# Patient Record
Sex: Female | Born: 1995 | Hispanic: Yes | Marital: Single | Smoking: Never smoker
Health system: Southern US, Community
[De-identification: ages and names within clinical notes are randomized; demographics above are authoritative.]

## PROBLEM LIST (undated history)

## (undated) ENCOUNTER — Inpatient Hospital Stay (HOSPITAL_COMMUNITY): Payer: Self-pay

## (undated) DIAGNOSIS — Z789 Other specified health status: Secondary | ICD-10-CM

## (undated) HISTORY — PX: WISDOM TOOTH EXTRACTION: SHX21

## (undated) HISTORY — PX: NO PAST SURGERIES: SHX2092

---

## 2017-04-14 ENCOUNTER — Other Ambulatory Visit: Payer: Self-pay

## 2017-04-14 ENCOUNTER — Inpatient Hospital Stay (HOSPITAL_COMMUNITY)
Admission: AD | Admit: 2017-04-14 | Discharge: 2017-04-14 | Disposition: A | Discharge status: Home / Self Care | Payer: Self-pay | Source: Ambulatory Visit | Attending: Family Medicine | Admitting: Family Medicine

## 2017-04-14 ENCOUNTER — Inpatient Hospital Stay (HOSPITAL_COMMUNITY): Payer: Self-pay

## 2017-04-14 ENCOUNTER — Encounter (HOSPITAL_COMMUNITY): Payer: Self-pay | Admitting: *Deleted

## 2017-04-14 DIAGNOSIS — N760 Acute vaginitis: Secondary | ICD-10-CM

## 2017-04-14 DIAGNOSIS — O26891 Other specified pregnancy related conditions, first trimester: Secondary | ICD-10-CM

## 2017-04-14 DIAGNOSIS — R102 Pelvic and perineal pain unspecified side: Secondary | ICD-10-CM

## 2017-04-14 DIAGNOSIS — O23591 Infection of other part of genital tract in pregnancy, first trimester: Secondary | ICD-10-CM | POA: Insufficient documentation

## 2017-04-14 DIAGNOSIS — O3401 Maternal care for unspecified congenital malformation of uterus, first trimester: Secondary | ICD-10-CM

## 2017-04-14 DIAGNOSIS — Z3A01 Less than 8 weeks gestation of pregnancy: Secondary | ICD-10-CM | POA: Insufficient documentation

## 2017-04-14 DIAGNOSIS — B9689 Other specified bacterial agents as the cause of diseases classified elsewhere: Secondary | ICD-10-CM | POA: Insufficient documentation

## 2017-04-14 DIAGNOSIS — Q512 Other doubling of uterus, unspecified: Secondary | ICD-10-CM

## 2017-04-14 DIAGNOSIS — Z880 Allergy status to penicillin: Secondary | ICD-10-CM | POA: Insufficient documentation

## 2017-04-14 LAB — WET PREP, GENITAL
Sperm: NONE SEEN
TRICH WET PREP: NONE SEEN
YEAST WET PREP: NONE SEEN

## 2017-04-14 LAB — CBC
HEMATOCRIT: 37.5 % (ref 36.0–46.0)
HEMOGLOBIN: 13 g/dL (ref 12.0–15.0)
MCH: 29 pg (ref 26.0–34.0)
MCHC: 34.7 g/dL (ref 30.0–36.0)
MCV: 83.5 fL (ref 78.0–100.0)
Platelets: 243 10*3/uL (ref 150–400)
RBC: 4.49 MIL/uL (ref 3.87–5.11)
RDW: 12.7 % (ref 11.5–15.5)
WBC: 14.1 10*3/uL — AB (ref 4.0–10.5)

## 2017-04-14 LAB — URINALYSIS, ROUTINE W REFLEX MICROSCOPIC
Bilirubin Urine: NEGATIVE
GLUCOSE, UA: 50 mg/dL — AB
Hgb urine dipstick: NEGATIVE
KETONES UR: NEGATIVE mg/dL
LEUKOCYTES UA: NEGATIVE
NITRITE: NEGATIVE
PROTEIN: NEGATIVE mg/dL
Specific Gravity, Urine: 1.025 (ref 1.005–1.030)
pH: 6 (ref 5.0–8.0)

## 2017-04-14 LAB — POCT PREGNANCY, URINE: Preg Test, Ur: POSITIVE — AB

## 2017-04-14 LAB — HCG, QUANTITATIVE, PREGNANCY: HCG, BETA CHAIN, QUANT, S: 85207 m[IU]/mL — AB (ref <5)

## 2017-04-14 MED ORDER — METRONIDAZOLE 500 MG PO TABS: 500.0000 mg | ORAL_TABLET | Freq: Two times a day (BID) | ORAL | 0 refills | Status: DC

## 2017-04-14 NOTE — Discharge Instructions (Signed)
Primer trimestre de embarazo °(First Trimester of Pregnancy) °El primer trimestre de embarazo se extiende desde la semana 1 hasta el final de la semana 12 (mes 1 al mes 3). Durante este tiempo, el bebé comenzará a desarrollarse dentro suyo. Entre la semana 6 y la 8, se forman los ojos y el rostro, y los latidos del corazón pueden verse en la ecografía. Al final de las 12 semanas, todos los órganos del bebé están formados. La atención prenatal es toda la asistencia médica que usted recibe antes del nacimiento del bebé. Asegúrese de recibir una buena atención prenatal y de seguir todas las indicaciones del médico. °CUIDADOS EN EL HOGAR °Medicamentos: °· Tome los medicamentos solamente como se lo haya indicado el médico. Algunos medicamentos se pueden tomar durante el embarazo y otros no. °· Tome las vitaminas prenatales como se lo haya indicado el médico. °· Tome el medicamento que la ayuda a defecar (laxante suave) según sea necesario, si el médico lo autoriza. °Dieta °· Ingiera alimentos saludables de manera regular. °· El médico le indicará la cantidad de peso que puede aumentar. °· No coma carne cruda ni quesos sin cocinar. °· Si tiene malestar estomacal (náuseas) o vomita: °? Ingiera 4 o 5 comidas pequeñas por día en lugar de 3 abundantes. °? Intente comer algunas galletitas saladas. °? Beba líquidos entre las comidas, en lugar de hacerlo durante estas. °· Si tiene dificultad para defecar (estreñimiento): °? Consuma alimentos con alto contenido de fibra, como verduras y frutas frescos, y cereales integrales. °? Beba suficiente líquido para mantener el pis (orina) claro o de color amarillo pálido. °Actividad y ejercicios °· Haga ejercicios solamente como se lo haya indicado el médico. Deje de hacer ejercicios si tiene cólicos o dolor en la parte baja del vientre (abdomen) o en la cintura. °· Intente no estar de pie durante mucho tiempo. Mueva las piernas con frecuencia si debe estar de pie en un lugar durante  mucho tiempo. °· Evite levantar pesos excesivos. °· Use zapatos con tacones bajos. Mantenga una buena postura al sentarse y pararse. °· Puede tener relaciones sexuales, a menos que el médico le indique lo contrario. °Alivio del dolor o las molestias °· Use un sostén que le brinde buen soporte si le duelen las mamas. °· Dese baños con agua tibia (baños de asiento) para aliviar el dolor o las molestias a causa de las hemorroides. Use crema antihemorroidal si el médico se lo permite. °· Descanse con las piernas elevadas si tiene calambres o dolor de cintura. °· Use medias de descanso si tiene las venas de las piernas hinchadas y abultadas (venas varicosas). Eleve los pies durante 15 minutos, 3 o 4 veces por día. Limite la cantidad de sal en su dieta. °Cuidados prenatales °· Programe las visitas prenatales para la semana 12 de embarazo. °· Escriba sus preguntas. Llévelas cuando concurra a las visitas prenatales. °· Concurra a todas las visitas prenatales como se lo haya indicado el médico. °Seguridad °· Colóquese el cinturón de seguridad cuando conduzca. °· Haga una lista con los números de teléfono en caso de emergencia, en la cual deben incluirse los números de los familiares, los amigos, el hospital y los departamentos de policía y de bomberos. °Consejos generales °· Pídale al médico que la derive a clases prenatales en su localidad. Debe comenzar a tomar las clases antes de entrar en el mes 6 de embarazo. °· Pida ayuda si necesita asesoramiento o asistencia con la alimentación. El médico puede aconsejarla o indicarle dónde recurrir para recibir ayuda. °· No se dé baños de inmersión en agua caliente, baños   turcos ni saunas.  No se haga duchas vaginales ni use tampones o toallas higinicas perfumadas.  No mantenga las piernas cruzadas durante [place] tiempo.  Evite el contacto con las bandejas sanitarias de los gatos y la tierra que estos animales usan.  No fume, no consuma hierbas ni beba alcohol. No tome  frmacos que el mdico no haya autorizado.  No consuma ningn producto que contenga tabaco, lo que incluye cigarrillos, tabaco de Theatre managermascar o Administrator, Civil Servicecigarrillos electrnicos. Si necesita ayuda para dejar de fumar, consulte al American Expressmdico. Puede recibir asesoramiento u otro tipo de apoyo para dejar de fumar.  Visite al dentista. En su casa, lvese los dientes con un cepillo dental suave. Psese el hilo dental con suavidad. SOLICITE AYUDA SI:  Tiene mareos.  Tiene clicos leves o siente presin en la parte baja del vientre.  Siente un dolor persistente en la zona del vientre.  Sigue teniendo AT&Tmalestar estomacal, vomita o las heces son lquidas (diarrea).  Observa una secrecin, con mal olor que proviene de la vagina.  Siente dolor al ConocoPhillipsorinar.  Tiene el rostro, las Fort Hunter Liggettmanos, las piernas o los tobillos ms hinchados (inflamados).  SOLICITE AYUDA DE INMEDIATO SI:  Tiene fiebre.  Tiene una prdida de lquido por la vagina.  Tiene sangrado o pequeas prdidas vaginales.  Tiene clicos o dolor muy intensos en el vientre.  Sube o baja de peso rpidamente.  Vomita sangre. Puede ser similar a la borra del caf  Est en contacto con personas que tienen rubola, la quinta enfermedad o varicela.  Siente un dolor de cabeza muy intenso.  Le falta el aire.  Sufre cualquier tipo de traumatismo, por ejemplo, debido a una cada o un accidente automovilstico.  Esta informacin no tiene Theme park managercomo fin reemplazar el consejo del mdico. Asegrese de hacerle al mdico cualquier pregunta que tenga. Document Released: 07/23/2008 Document Revised: 05/17/2014 Document Reviewed: 03/06/2013 Elsevier Interactive Patient Education  2017 Elsevier Inc. Vaginosis bacteriana (Bacterial Vaginosis) La vaginosis bacteriana es una infeccin vaginal que perturba el equilibrio normal de las bacterias que se encuentran en la vagina. Es el resultado de un crecimiento excesivo de ciertas bacterias. Esta es la infeccin vaginal ms  frecuente en mujeres en edad reproductiva. El tratamiento es importante para prevenir complicaciones, especialmente en mujeres embarazadas, dado que puede causar un parto prematuro. CAUSAS La vaginosis bacteriana se origina por un aumento de bacterias nocivas que, generalmente, estn presentes en cantidades ms pequeas en la vagina. Varios tipos diferentes de bacterias pueden causar esta afeccin. Sin embargo, la causa de su desarrollo no se comprende totalmente. FACTORES DE RIESGO Ciertas actividades o comportamientos pueden exponerlo a un mayor riesgo de desarrollar vaginosis bacteriana, entre los que se incluyen:  Tener una nueva pareja sexual o mltiples parejas sexuales.  Las duchas vaginales  El uso del DIU (dispositivo intrauterino) como mtodo anticonceptivo. El contagio no se produce en baos, por ropas de cama, en piscinas o por contacto con objetos. SIGNOS Y SNTOMAS Algunas mujeres que padecen vaginosis bacteriana no presentan signos ni sntomas. Los sntomas ms comunes son:  Secrecin vaginal de color grisceo.  Secrecin vaginal con olor similar al Wal-Martpescado, especialmente despus de Sales promotion account executivemantener relaciones sexuales.  Picazn o sensacin de ardor en la vagina o la vulva.  Ardor o dolor al ConocoPhillipsorinar. DIAGNSTICO Su mdico analizar su historia clnica y le examinar la vagina para detectar signos de vaginosis bacteriana. Puede tomarle Lauris Poaguna muestra de flujo vaginal. Su mdico examinar esta muestra con un microscopio para controlar las bacterias y clulas anormales. Tambin  puede realizarse un anlisis del pH vaginal. TRATAMIENTO La vaginosis bacteriana puede tratarse con antibiticos, en forma de comprimidos o de crema vaginal. Puede indicarse una segunda tanda de antibiticos si la afeccin se repite despus del tratamiento. Debido a que la vaginosis bacteriana aumenta el riesgo de contraer enfermedades de transmisin sexual, el tratamiento puede ayudar a reducir el riesgo de clamidia,  Willitsgonorrea, VIH y herpes. INSTRUCCIONES PARA EL CUIDADO EN EL HOGAR  Tome solo medicamentos de venta libre o recetados, segn las indicaciones del mdico.  Si le han recetado antibiticos, tmelos como se le indic. Asegrese de que finaliza la prescripcin completa aunque se sienta mejor.  Comunique a sus compaeros sexuales que sufre una infeccin vaginal. Deben consultar a su mdico y recibir tratamiento si tienen problemas, como picazn o una erupcin cutnea leve.  Durante el Monmouthtratamiento, es importante que siga estas indicaciones: ? Soil scientistvite mantener relaciones sexuales o use preservativos de Network engineerla forma correcta. ? No se haga duchas vaginales. ? Evite consumir alcohol como se lo haya indicado el mdico. ? Evite amamantar como se lo haya indicado el mdico.  SOLICITE ATENCIN MDICA SI:  Sus sntomas no mejoran despus de 3 das de Fowlertratamiento.  Aumenta la secrecin o Chief Technology Officerel dolor.  Tiene fiebre.  ASEGRESE DE QUE:  Comprende estas instrucciones.  Controlar su afeccin.  Recibir ayuda de inmediato si no mejora o si empeora.  PARA OBTENER MS INFORMACIN Centros para el control y la prevencin de Child psychotherapistenfermedades (Centers for Disease Control and Prevention, CDC): SolutionApps.co.zawww.cdc.gov/std Asociacin Estadounidense de la Salud Sexual (American Sexual Health Association, SHA): www.ashastd.org Esta informacin no tiene Theme park managercomo fin reemplazar el consejo del mdico. Asegrese de hacerle al mdico cualquier pregunta que tenga. Document Released: 08/03/2007 Document Revised: 05/17/2014 Document Reviewed: 12/06/2012 Elsevier Interactive Patient Education  2017 Arvin[employer]itorElsevier Inc.

## 2017-04-14 NOTE — MAU Provider Note (Signed)
Chief Complaint: Abdominal Pain   First Provider Initiated Contact with Patient 04/14/17 1744        SUBJECTIVE HPI: Stephanie Quinn is a 21 y.o. G1P0 at [redacted]w[redacted]d by LMP who presents to maternity admissions reporting pelvic pain for 3 days.  Somewhat crampy.  No bleeding.  Wants ultrasound..This is her first pregnancy She denies vaginal bleeding, vaginal itching/burning, urinary symptoms, h/a, dizziness, n/v, or fever/chills.    Has had no care yet, but has appointment at [facility]  Abdominal Pain  This is a new problem. The current episode started in the past 7 days. The onset quality is gradual. The problem occurs intermittently. The problem has been gradually improving. The pain is located in the LLQ, RLQ and suprapubic region. The pain is mild. The quality of the pain is cramping. The abdominal pain does not radiate. Pertinent negatives include no constipation, diarrhea, dysuria, fever, frequency, headaches, myalgias, nausea or vomiting. Nothing aggravates the pain. The pain is relieved by nothing. She has tried nothing for the symptoms.    RN Note: Pt reports some vaginal and abd pain x 3 days. Denies any vaginal bleeding or discharge. Positive pregnancy test at a clinic in November 28.    History reviewed. No pertinent past medical history. History reviewed. No pertinent surgical history. Social History   Socioeconomic History  . Marital status: Single    Spouse name: Not on file  . Number of children: Not on file  . Years of education: Not on file  . Highest education level: Not on file  Social Needs  . Financial resource strain: Not on file  . Food insecurity - worry: Not on file  . Food insecurity - inability: Not on file  . Transportation needs - medical: Not on file  . Transportation needs - non-medical: Not on file  Occupational History  . Not on file  Tobacco Use  . Smoking status: Never Smoker  . Smokeless tobacco: Never Used  Substance and Sexual  Activity  . Alcohol use: No    Frequency: Never  . Drug use: No  . Sexual activity: No  Other Topics Concern  . Not on file  Social History Narrative  . Not on file   No current facility-administered medications on file prior to encounter.    No current outpatient medications on file prior to encounter.   Allergies  Allergen Reactions  . Penicillins Itching and Rash    I have reviewed patient's Past Medical Hx, Surgical Hx, Family Hx, Social Hx, medications and allergies.   ROS:  Review of Systems  Constitutional: Negative for fever.  Gastrointestinal: Positive for abdominal pain. Negative for constipation, diarrhea, nausea and vomiting.  Genitourinary: Negative for dysuria and frequency.  Musculoskeletal: Negative for myalgias.  Neurological: Negative for headaches.    Other systems negative   Physical Exam  Physical Exam Patient Vitals for the past 24 hrs:  BP Temp Pulse Resp Height Weight  04/14/17 1639 (!) 120/59 98.1 F (36.7 C) 71 18 5' (1.524 m) 141 lb (64 kg)   Constitutional: Well-developed, well-nourished female in no acute distress.  Cardiovascular: normal rate Respiratory: normal effort GI: Abd soft, non-tender. Pos BS x 4 MS: Extremities nontender, no edema, normal ROM Neurologic: Alert and oriented x 4.  GU: Neg CVAT.  PELVIC EXAM: Cervix pink, visually closed, without lesion, scant white creamy discharge, vaginal walls and external genitalia normal Bimanual exam: Cervix 0/long/high, firm, anterior, neg CMT, uterus nontender, nonenlarged, adnexa without tenderness, enlargement, or mass  LAB RESULTS Results for orders placed or performed during the hospital encounter of 04/14/17 (from the past 24 hour(s))  Urinalysis, Routine w reflex microscopic     Status: Abnormal   Collection Time: 04/14/17  4:41 PM  Result Value Ref Range   Color, Urine YELLOW YELLOW   APPearance HAZY (A) CLEAR   Specific Gravity, Urine 1.025 1.005 - 1.030   pH 6.0 5.0 -  8.0   Glucose, UA 50 (A) NEGATIVE mg/dL   Hgb urine dipstick NEGATIVE NEGATIVE   Bilirubin Urine NEGATIVE NEGATIVE   Ketones, ur NEGATIVE NEGATIVE mg/dL   Protein, ur NEGATIVE NEGATIVE mg/dL   Nitrite NEGATIVE NEGATIVE   Leukocytes, UA NEGATIVE NEGATIVE  Wet prep, genital     Status: Abnormal   Collection Time: 04/14/17  4:47 PM  Result Value Ref Range   Yeast Wet Prep HPF POC NONE SEEN NONE SEEN   Trich, Wet Prep NONE SEEN NONE SEEN   Clue Cells Wet Prep HPF POC PRESENT (A) NONE SEEN   WBC, Wet Prep HPF POC MODERATE (A) NONE SEEN   Sperm NONE SEEN   Pregnancy, urine POC     Status: Abnormal   Collection Time: 04/14/17  4:58 PM  Result Value Ref Range   Preg Test, Ur POSITIVE (A) NEGATIVE  CBC     Status: Abnormal   Collection Time: 04/14/17  5:02 PM  Result Value Ref Range   WBC 14.1 (H) 4.0 - 10.5 K/uL   RBC 4.49 3.87 - 5.11 MIL/uL   Hemoglobin 13.0 12.0 - 15.0 g/dL   HCT 16.137.5 09.636.0 - 04.546.0 %   MCV 83.5 78.0 - 100.0 fL   MCH 29.0 26.0 - 34.0 pg   MCHC 34.7 30.0 - 36.0 g/dL   RDW 40.912.7 81.111.5 - 91.415.5 %   Platelets 243 150 - 400 K/uL  hCG, quantitative, pregnancy     Status: Abnormal   Collection Time: 04/14/17  5:02 PM  Result Value Ref Range   hCG, Beta Chain, Quant, S 85,207 (H) <5 mIU/mL    IMAGING Kore[place] Ob Comp Less 14 Wks  Result Date: 04/14/2017 CLINICAL DATA:  Pelvic pain with positive pregnancy test. EXAM: OBSTETRIC <14 WK US AND TRANSVAGINAL OB US TECHNIQUE: Both transabdominal and transvaginal ultrasound examinations were performed for complete evaluation of the gestation as well as the maternal uterus, adnexal regions, and pelvic cul-de-sac. Transvaginal technique was performed to assess early pregnancy. COMPARISON:  None. FINDINGS: Intrauterine gestational sac: Single. Yolk sac:  Visualized. Embryo:  Visualized. Cardiac Activity: Visualized. Heart Rate: 134  bpm CRL:  8  mm   6 w   5 d                  US EDC: 12/03/2017 Subchorionic hemorrhage:  Small Maternal  uterus/adnexae: Maternal ovaries unremarkable. 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 IMPRESSION: 1. Single living intrauterine gestation at estimated 6 week 5 day gestational age by crown-rump length. 2. 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 Electronically Signed   By: Kennith CenterEric  Mansell M.D.   On: 04/14/2017 19:28   [place] Ob Transvaginal  Result Date: 04/14/2017 CLINICAL DATA:  Pelvic pain with positive pregnancy test. EXAM: OBSTETRIC <14 WK US AND TRANSVAGINAL OB US TECHNIQUE: Both transabdominal and transvaginal ultrasound examinations were performed for complete evaluation of the gestation as well as the maternal uterus, adnexal regions, and pelvic cul-de-sac. Transvaginal technique was performed to assess early pregnancy. COMPARISON:  None. FINDINGS: Intrauterine  gestational sac: Single. Yolk sac:  Visualized. Embryo:  Visualized. Cardiac Activity: Visualized. Heart Rate: 134  bpm CRL:  8  mm   6 w   5 d                  US EDC: 12/03/2017 Subchorionic hemorrhage:  Small Maternal uterus/adnexae: Maternal ovaries unremarkable. 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 IMPRESSION: 1. Single living intrauterine gestation at estimated 6 week 5 day gestational age by crown-rump length. 2. 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 Electronically Signed   By: Kennith CenterEric  Mansell M.D.   On: 04/14/2017 19:28    MAU Management/MDM: Ordered usual first trimester r/o ectopic labs.   Pelvic exam and cultures done Will check baseline Ultrasound to rule out ectopic.  This bleeding/pain can represent a normal pregnancy with bleeding, spontaneous abortion or even an ectopic which can be life-threatening.  The process as listed above helps to determine which of these is present.  Discussed results of ultrasound. Recommend pelvic  rest.   ASSESSMENT 1. Pelvic pain affecting pregnancy in first trimester, antepartum   2. Pelvic pain affecting pregnancy in first trimester, antepartum   3.    Single IUP at 5971w5d 4.      Bacterial Vaginosis 5.      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 Encouraged to return here or to other Urgent Care/ED if she develops worsening of symptoms, increase in pain, fever, or other concerning symptoms.    Wynelle BourgeoisMarie Williams CNM, MSN Certified Nurse-Midwife 04/14/2017  5:45 PM

## 2017-04-14 NOTE — MAU Note (Signed)
Pt reports some vaginal and abd pain x 3 days. Denies any vaginal bleeding or discharge. Positive pregnancy test at a clinic in November 28.

## 2017-04-15 LAB — GC/CHLAMYDIA PROBE AMP (~~LOC~~) NOT AT ARMC
CHLAMYDIA, DNA PROBE: NEGATIVE
NEISSERIA GONORRHEA: NEGATIVE

## 2017-04-15 LAB — HIV ANTIBODY (ROUTINE TESTING W REFLEX): HIV SCREEN 4TH GENERATION: NONREACTIVE

## 2017-05-06 LAB — OB RESULTS CONSOLE ABO/RH: RH Type: POSITIVE

## 2017-05-06 LAB — OB RESULTS CONSOLE RUBELLA ANTIBODY, IGM: RUBELLA: IMMUNE

## 2017-05-06 LAB — OB RESULTS CONSOLE GC/CHLAMYDIA: Gonorrhea: NEGATIVE

## 2017-05-06 LAB — OB RESULTS CONSOLE HIV ANTIBODY (ROUTINE TESTING): HIV: NONREACTIVE

## 2017-05-06 LAB — OB RESULTS CONSOLE RPR: RPR: NONREACTIVE

## 2017-05-06 LAB — OB RESULTS CONSOLE ANTIBODY SCREEN: ANTIBODY SCREEN: NEGATIVE

## 2017-05-06 LAB — OB RESULTS CONSOLE HEPATITIS B SURFACE ANTIGEN: HEP B S AG: NEGATIVE

## 2017-05-09 ENCOUNTER — Other Ambulatory Visit (HOSPITAL_COMMUNITY): Payer: Self-pay | Admitting: Nurse Practitioner

## 2017-05-09 DIAGNOSIS — Z3A13 13 weeks gestation of pregnancy: Secondary | ICD-10-CM

## 2017-05-09 DIAGNOSIS — Z3682 Encounter for antenatal screening for nuchal translucency: Secondary | ICD-10-CM

## 2017-05-10 NOTE — L&D Delivery Note (Signed)
Patient is a 22 y.o. now G1P1 s/p NSVD at 7155w3d, who was admitted for SOL.  She progressed with augmentation (AROM) to complete and pushed 20 minutes to deliver.  Cord clamping delayed by several minutes then clamped by CNM and cut by FOB.  Placenta intact and spontaneous, bleeding minimal.  Bilateral labial laceration identified not repaired- hemostatic.  Mom and baby stable prior to transfer to postpartum. She plans on breast and formula feeding. She requests POPs for birth control.  Delivery Note At 12:11 AM a viable and healthy female was delivered via Vaginal, Spontaneous (Presentation: LOA ).  APGAR: 9, 9; weight 6 lb 4.9 oz (2860 g).   Placenta intact and spontaneous, bleeding minimal. 3V Cord:  with no complications.  Anesthesia: Epidural  Episiotomy: None Lacerations: Labial Suture Repair: none Est. Blood Loss (mL): 150  Mom to postpartum.  Baby to Couplet care / Skin to Skin.  Sharyon CableVeronica C Kaithlyn Teagle CNM 11/29/2017, 1:14 AM

## 2017-05-24 ENCOUNTER — Encounter (HOSPITAL_COMMUNITY): Payer: Self-pay | Admitting: Nurse Practitioner

## 2017-05-27 ENCOUNTER — Encounter (HOSPITAL_COMMUNITY): Payer: Self-pay

## 2017-05-31 ENCOUNTER — Encounter (HOSPITAL_COMMUNITY): Payer: Self-pay

## 2017-05-31 ENCOUNTER — Ambulatory Visit (HOSPITAL_COMMUNITY)
Admission: RE | Admit: 2017-05-31 | Discharge: 2017-05-31 | Disposition: A | Discharge status: Home / Self Care | Payer: Self-pay | Source: Ambulatory Visit | Attending: Nurse Practitioner | Admitting: Nurse Practitioner

## 2017-05-31 DIAGNOSIS — Z3682 Encounter for antenatal screening for nuchal translucency: Secondary | ICD-10-CM | POA: Insufficient documentation

## 2017-05-31 DIAGNOSIS — Z3A13 13 weeks gestation of pregnancy: Secondary | ICD-10-CM | POA: Insufficient documentation

## 2017-05-31 DIAGNOSIS — Z3481 Encounter for supervision of other normal pregnancy, first trimester: Secondary | ICD-10-CM | POA: Insufficient documentation

## 2017-05-31 HISTORY — DX: Other specified health status: Z78.9

## 2017-06-09 ENCOUNTER — Other Ambulatory Visit (HOSPITAL_COMMUNITY): Payer: Self-pay

## 2017-11-01 LAB — OB RESULTS CONSOLE GC/CHLAMYDIA
CHLAMYDIA, DNA PROBE: NEGATIVE
Gonorrhea: NEGATIVE

## 2017-11-01 LAB — OB RESULTS CONSOLE GBS: GBS: NEGATIVE

## 2017-11-27 ENCOUNTER — Inpatient Hospital Stay (HOSPITAL_COMMUNITY)
Admission: AD | Admit: 2017-11-27 | Discharge: 2017-11-27 | Disposition: A | Discharge status: Home / Self Care | Payer: Medicaid Other | Source: Ambulatory Visit | Attending: Obstetrics and Gynecology | Admitting: Obstetrics and Gynecology

## 2017-11-27 ENCOUNTER — Encounter (HOSPITAL_COMMUNITY): Payer: Self-pay | Admitting: *Deleted

## 2017-11-27 ENCOUNTER — Inpatient Hospital Stay (EMERGENCY_DEPARTMENT_HOSPITAL)
Admission: AD | Admit: 2017-11-27 | Discharge: 2017-11-28 | Disposition: A | Discharge status: Home / Self Care | Payer: Medicaid Other | Source: Ambulatory Visit | Attending: Obstetrics and Gynecology | Admitting: Obstetrics and Gynecology

## 2017-11-27 DIAGNOSIS — O479 False labor, unspecified: Secondary | ICD-10-CM

## 2017-11-27 DIAGNOSIS — O471 False labor at or after 37 completed weeks of gestation: Secondary | ICD-10-CM

## 2017-11-27 DIAGNOSIS — Z3A39 39 weeks gestation of pregnancy: Secondary | ICD-10-CM

## 2017-11-27 NOTE — Progress Notes (Signed)
Stephanie BourgeoisMarie Quinn CNM called and in delivery. Judeth HornErin Lawrence NP on unit. Aware of pts sve and ctx pattern with reck for labor. Pt stable for d/c home. EFM strip reviewed by Np

## 2017-11-27 NOTE — Discharge Instructions (Signed)
Evaluacin de los movimientos fetales  Fetal Movement Counts  Introduccin  Nombre del paciente: ________________________________________________ Fecha de parto estimada: ____________________  Qu es una evaluacin de los movimientos fetales?  Una evaluacin de los movimientos fetales es el registro del nmero de veces que siente que el beb se mueve durante un cierto perodo de tiempo. Esto tambin se puede denominar recuento de patadas fetales. Una evaluacin de movimientos fetales se recomienda a todas las embarazadas. Es posible que le indiquen que comience a evaluar los movimientos fetales desde la semana 28 de embarazo.  Preste atencin cuando sienta que el beb est ms activo. Podr detectar los ciclos en que el beb duerme y est despierto. Tambin podr detectar que ciertas cosas hacen que su beb se mueva ms. Deber realizar una evaluacin de los movimientos fetales en las siguientes situaciones:   Cuando el beb est ms activo habitualmente.   A la misma hora, todos los das.    Un buen momento para evaluar los movimientos fetales es cuando est descansando, despus de haber comido y bebido algo.  Cmo debo contar los movimientos fetales?  1. Encuentre un lugar tranquilo y cmodo. Sintese o acustese de lado.  2. Anote la fecha, la hora de inicio y de finalizacin y la cantidad de movimientos que sinti entre esas dos horas. Lleve esta informacin a las visitas de control.  3. Cuente las pataditas, revoloteos, chasquidos, vueltas o pinchazos en un perodo de 2horas. Debe sentir al menos 10movimientos en 2horas.  4. Cuando sienta 10movimientos, puede dejar de contar.  5. Si no siente 10movimientos en 2horas, coma y beba algo. Luego, contine descansando y contando durante 1hora. Si siente al menos 4movimientos durante esa hora, puede dejar de contar.  Comunquese con un mdico si:   Siente menos de 4movimientos en 2horas.   El beb no se mueve tanto como suele hacerlo.  Fecha:  ____________ Hora de inicio: ____________ Hora de finalizacin: ____________ Movimientos: ____________  Fecha: ____________ Hora de inicio: ____________ Hora de finalizacin: ____________ Movimientos: ____________  Fecha: ____________ Hora de inicio: ____________ Hora de finalizacin: ____________ Movimientos: ____________  Fecha: ____________ Hora de inicio: ____________ Hora de finalizacin: ____________ Movimientos: ____________  Fecha: ____________ Hora de inicio: ____________ Hora de finalizacin: ____________ Movimientos: ____________  Fecha: ____________ Hora de inicio: ____________ Hora de finalizacin: ____________ Movimientos: ____________  Fecha: ____________ Hora de inicio: ____________ Hora de finalizacin: ____________ Movimientos: ____________  Fecha: ____________ Hora de inicio: ____________ Hora de finalizacin: ____________ Movimientos: ____________  Fecha: ____________ Hora de inicio: ____________ Hora de finalizacin: ____________ Movimientos: ____________  Esta informacin no tiene como fin reemplazar el consejo del mdico. Asegrese de hacerle al mdico cualquier pregunta que tenga.  Document Released: 08/03/2007 Document Revised: 07/30/2016 Document Reviewed: 06/05/2015  Elsevier Interactive Patient Education  2018 Elsevier Inc.

## 2017-11-27 NOTE — Progress Notes (Signed)
Artelia LarocheM Williams CNM notified of pt's admission but no report given at this time. CNM requested to call back as she is in with pt

## 2017-11-27 NOTE — Progress Notes (Signed)
efm d/c for d/c home

## 2017-11-27 NOTE — MAU Note (Signed)
Urine in lab 

## 2017-11-27 NOTE — Progress Notes (Signed)
Fetal Tracing:  Baseline: 125 Variability: moderate Accelerations: 15x15 Decelerations: none  Toco: Q 4-8 mins  Reactive nst   Judeth HornLawrence, Lorece Keach, NP

## 2017-11-27 NOTE — Progress Notes (Signed)
STratus unavailable. Being used with another pt.

## 2017-11-27 NOTE — Progress Notes (Signed)
Written and verbal d/c instructions given and understanding voiced. 

## 2017-11-27 NOTE — Progress Notes (Signed)
Artelia LarocheM Williams CNM called unit and given report of pt's status. Will observe an hour and reck.

## 2017-11-27 NOTE — MAU Note (Signed)
Ctxs for a week. Stronger since 2200. Denies LOF or bleeding. 1.5cm last sve

## 2017-11-28 ENCOUNTER — Inpatient Hospital Stay (HOSPITAL_COMMUNITY): Payer: Medicaid Other | Admitting: Anesthesiology

## 2017-11-28 ENCOUNTER — Encounter (HOSPITAL_COMMUNITY): Payer: Self-pay | Admitting: *Deleted

## 2017-11-28 ENCOUNTER — Inpatient Hospital Stay (HOSPITAL_COMMUNITY)
Admission: AD | Admit: 2017-11-28 | Discharge: 2017-11-30 | DRG: 807 | Disposition: A | Discharge status: Home / Self Care | Payer: Medicaid Other | Attending: Obstetrics and Gynecology | Admitting: Obstetrics and Gynecology

## 2017-11-28 DIAGNOSIS — Z880 Allergy status to penicillin: Secondary | ICD-10-CM | POA: Diagnosis not present

## 2017-11-28 DIAGNOSIS — O3403 Maternal care for unspecified congenital malformation of uterus, third trimester: Principal | ICD-10-CM | POA: Diagnosis present

## 2017-11-28 DIAGNOSIS — Q512 Other doubling of uterus, unspecified: Secondary | ICD-10-CM

## 2017-11-28 DIAGNOSIS — O479 False labor, unspecified: Secondary | ICD-10-CM

## 2017-11-28 DIAGNOSIS — Z3A39 39 weeks gestation of pregnancy: Secondary | ICD-10-CM | POA: Diagnosis not present

## 2017-11-28 DIAGNOSIS — Z3483 Encounter for supervision of other normal pregnancy, third trimester: Secondary | ICD-10-CM | POA: Diagnosis present

## 2017-11-28 DIAGNOSIS — O471 False labor at or after 37 completed weeks of gestation: Secondary | ICD-10-CM | POA: Diagnosis not present

## 2017-11-28 LAB — TYPE AND SCREEN
ABO/RH(D): O POS
Antibody Screen: NEGATIVE

## 2017-11-28 LAB — CBC
HCT: 42.5 % (ref 36.0–46.0)
HEMOGLOBIN: 15 g/dL (ref 12.0–15.0)
MCH: 30.9 pg (ref 26.0–34.0)
MCHC: 35.3 g/dL (ref 30.0–36.0)
MCV: 87.6 fL (ref 78.0–100.0)
Platelets: 219 10*3/uL (ref 150–400)
RBC: 4.85 MIL/uL (ref 3.87–5.11)
RDW: 13.8 % (ref 11.5–15.5)
WBC: 14.8 10*3/uL — ABNORMAL HIGH (ref 4.0–10.5)

## 2017-11-28 LAB — ABO/RH: ABO/RH(D): O POS

## 2017-11-28 MED ORDER — LIDOCAINE HCL (PF) 1 % IJ SOLN
30.0000 mL | INTRAMUSCULAR | Status: DC | PRN
  Filled 2017-11-28: qty 30

## 2017-11-28 MED ORDER — ACETAMINOPHEN 325 MG PO TABS: 650.0000 mg | ORAL_TABLET | ORAL | Status: DC | PRN

## 2017-11-28 MED ORDER — OXYTOCIN BOLUS FROM INFUSION
500.0000 mL | Freq: Once | INTRAVENOUS | Status: AC
  Administered 2017-11-29: 500 mL via INTRAVENOUS

## 2017-11-28 MED ORDER — OXYTOCIN 40 UNITS IN LACTATED RINGERS INFUSION - SIMPLE MED
2.5000 [IU]/h | INTRAVENOUS | Status: DC
  Administered 2017-11-29: 2.5 [IU]/h via INTRAVENOUS
  Filled 2017-11-28: qty 1000

## 2017-11-28 MED ORDER — FLEET ENEMA 7-19 GM/118ML RE ENEM: 1.0000 | ENEMA | RECTAL | Status: DC | PRN

## 2017-11-28 MED ORDER — OXYCODONE-ACETAMINOPHEN 5-325 MG PO TABS: 1.0000 | ORAL_TABLET | ORAL | Status: DC | PRN

## 2017-11-28 MED ORDER — FENTANYL 2.5 MCG/ML BUPIVACAINE 1/10 % EPIDURAL INFUSION (WH - ANES)
INTRAMUSCULAR | Status: AC
  Filled 2017-11-28: qty 100

## 2017-11-28 MED ORDER — OXYCODONE-ACETAMINOPHEN 5-325 MG PO TABS: 2.0000 | ORAL_TABLET | ORAL | Status: DC | PRN

## 2017-11-28 MED ORDER — PHENYLEPHRINE 40 MCG/ML (10ML) SYRINGE FOR IV PUSH (FOR BLOOD PRESSURE SUPPORT)
80.0000 ug | PREFILLED_SYRINGE | INTRAVENOUS | Status: DC | PRN
  Filled 2017-11-28: qty 5

## 2017-11-28 MED ORDER — LACTATED RINGERS IV SOLN
INTRAVENOUS | Status: DC
  Administered 2017-11-28 (×2): via INTRAVENOUS

## 2017-11-28 MED ORDER — MORPHINE SULFATE (PF) 4 MG/ML IV SOLN
4.0000 mg | Freq: Once | INTRAVENOUS | Status: AC
  Administered 2017-11-28: 4 mg via INTRAMUSCULAR
  Filled 2017-11-28: qty 1

## 2017-11-28 MED ORDER — EPHEDRINE 5 MG/ML INJ
10.0000 mg | INTRAVENOUS | Status: DC | PRN
  Filled 2017-11-28: qty 2

## 2017-11-28 MED ORDER — LIDOCAINE HCL (PF) 1 % IJ SOLN
INTRAMUSCULAR | Status: DC | PRN
  Administered 2017-11-28: 4 mL via EPIDURAL
  Administered 2017-11-28: 5 mL via EPIDURAL

## 2017-11-28 MED ORDER — ONDANSETRON HCL 4 MG/2ML IJ SOLN: 4.0000 mg | Freq: Four times a day (QID) | INTRAMUSCULAR | Status: DC | PRN

## 2017-11-28 MED ORDER — PROMETHAZINE HCL 25 MG/ML IJ SOLN
12.5000 mg | Freq: Once | INTRAMUSCULAR | Status: AC
  Administered 2017-11-28: 12.5 mg via INTRAMUSCULAR
  Filled 2017-11-28: qty 1

## 2017-11-28 MED ORDER — PHENYLEPHRINE 40 MCG/ML (10ML) SYRINGE FOR IV PUSH (FOR BLOOD PRESSURE SUPPORT)
PREFILLED_SYRINGE | INTRAVENOUS | Status: AC
  Filled 2017-11-28: qty 20

## 2017-11-28 MED ORDER — LACTATED RINGERS IV SOLN: 500.0000 mL | INTRAVENOUS | Status: DC | PRN

## 2017-11-28 MED ORDER — SOD CITRATE-CITRIC ACID 500-334 MG/5ML PO SOLN: 30.0000 mL | ORAL | Status: DC | PRN

## 2017-11-28 MED ORDER — DIPHENHYDRAMINE HCL 50 MG/ML IJ SOLN: 12.5000 mg | INTRAMUSCULAR | Status: DC | PRN

## 2017-11-28 MED ORDER — LACTATED RINGERS IV SOLN: 500.0000 mL | Freq: Once | INTRAVENOUS | Status: DC

## 2017-11-28 MED ORDER — FENTANYL 2.5 MCG/ML BUPIVACAINE 1/10 % EPIDURAL INFUSION (WH - ANES)
14.0000 mL/h | INTRAMUSCULAR | Status: DC | PRN
  Administered 2017-11-28: 12 mL/h via EPIDURAL

## 2017-11-28 MED ORDER — FENTANYL CITRATE (PF) 100 MCG/2ML IJ SOLN: 100.0000 ug | INTRAMUSCULAR | Status: DC | PRN

## 2017-11-28 NOTE — Progress Notes (Signed)
LABOR PROGRESS NOTE  Stephanie Quinn is a 22 y.o. G1P0 at 7664w2d  admitted for SOL.   Subjective: Patient breathing through contractions, rates pain 9/10- patient request epidural at this time   Objective: BP 126/74   Pulse 96   Temp 98.8 F (37.1 C) (Oral)   Resp 17   Ht 5' (1.524 m)   Wt 162 lb (73.5 kg)   LMP 03/09/2017 (LMP Unknown)   SpO2 99%   BMI 31.64 kg/m  or  Vitals:   11/28/17 2120 11/28/17 2125 11/28/17 2129 11/28/17 2130  BP: 126/71 125/73  126/74  Pulse: 84 82  96  Resp: 19 18  17   Temp:      TempSrc:      SpO2: 98% 99% 99%   Weight:      Height:        AROM @2038 - Heavy Mec  Dilation: 5 Effacement (%): 90 Cervical Position: Middle Station: -2 Presentation: Vertex Exam by:: Lanice ShirtsV Kallee Nam CNM  FHT: baseline rate 135, moderate varibility, +acel, no decel Toco: 3-7 minutes/ moderate by palpation   Labs: Lab Results  Component Value Date   WBC 14.8 (H) 11/28/2017   HGB 15.0 11/28/2017   HCT 42.5 11/28/2017   MCV 87.6 11/28/2017   PLT 219 11/28/2017    Patient Active Problem List   Diagnosis Date Noted  . False labor 11/28/2017  . Normal labor 11/28/2017  . 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 G1P0 at 9564w2d here for SOL.  Labor: AROM, possible pitocin augmentation if no cervical change in 2 hours  Fetal Wellbeing:  Cat I Pain Control:  Epidural  Anticipated MOD:  SVD  Sharyon CableRogers, Muaad Boehning C, CNM 11/28/2017, 9:32 PM

## 2017-11-28 NOTE — MAU Note (Signed)
Pt presents to MAU with complaints of worsening contractions since this morning. + FM

## 2017-11-28 NOTE — Discharge Instructions (Signed)
Dolor abdominal durante el embarazo (Abdominal Pain During Pregnancy) El dolor de vientre (abdominal) es habitual durante el embarazo. Generalmente no se trata de un problema grave. Otras veces puede ser un signo de que algo no anda bien. Siempre comunquese con su mdico si tiene dolor abdominal. CUIDADOS EN EL HOGAR Controle el dolor para ver si hay cambios. Las indicaciones que siguen pueden ayudarla a sentirse mejor:  Notenga sexo (relaciones sexuales) ni se coloque nada dentro de la vagina hasta que se sienta mejor.  Haga reposo hasta que el dolor se calme.  Si siente ganas de vomitar (nuseas ) beba lquidos claros. No consuma alimentos slidos hasta que se sienta mejor.  Slo tome los medicamentos que le haya indicado su mdico.  Cumpla con las visitas al mdico segn las indicaciones. SOLICITE AYUDA DE INMEDIATO SI:  Tiene un sangrado, pierde lquido o elimina trozos de tejido por la vagina.  Siente ms dolor o clicos.  Comienza a vomitar.  Siente dolor al orinar u observa sangre en la orina.  Tiene fiebre.  No siente que el beb se mueva mucho.  Se siente muy dbil o cree que va a desmayarse.  Tiene dificultad para respirar con o sin dolor en el vientre.  Siente un dolor de cabeza muy intenso y dolor en el vientre.  Observa que sale un lquido por la vagina y tiene dolor abdominal.  La materia fecal es lquida (diarrea).  El dolor en el viente no desaparece, o empeora, luego de hacer reposo. ASEGRESE DE QUE:  Comprende estas instrucciones.  Controlar su afeccin.  Recibir ayuda de inmediato si no mejora o si empeora. Esta informacin no tiene como fin reemplazar el consejo del mdico. Asegrese de hacerle al mdico cualquier pregunta que tenga. Document Released: 01/06/2011 Document Revised: 08/18/2015 Document Reviewed: 11/23/2012 Elsevier Interactive Patient Education  2018 [employer]vier Inc[employer]  

## 2017-11-28 NOTE — H&P (Addendum)
OBSTETRIC ADMISSION HISTORY AND PHYSICAL  Jaquelyne Firkus is a 22 y.o. female G1P0 with IUP at [redacted]w[redacted]d by LMP presenting for spontaneous onset of labor. She started feeling contractions Saturday, and was awoken this morning by painful contractions.  Her contractions are currently 5 minutes apart and painful.  She endorses fetal movement, denies Loss of fluid and vaginal bleeding. She denies any other complications during this pregnancy.  She denies HA, blurry vision, SOB, RUQ pain, leg pain. She plans on breast and bottle feeding. She request POPs for birth control. 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   Past Medical History: Past Medical History:  Diagnosis Date  . Medical history non-contributory     Past Surgical History: Past Surgical History:  Procedure Laterality Date  . NO PAST SURGERIES    . WISDOM TOOTH EXTRACTION      Obstetrical History: OB History    Gravida  1   Para      Term      Preterm      AB      Living        SAB      TAB      Ectopic      Multiple      Live Births              Social History: Social History   Socioeconomic History  . Marital status: Single    Spouse name: Not on file  . Number of children: Not on file  . Years of education: Not on file  . Highest education level: Not on file  Occupational History  . Not on file  Social Needs  . Financial resource strain: Not on file  . Food insecurity:    Worry: Not on file    Inability: Not on file  . Transportation needs:    Medical: Not on file    Non-medical: Not on file  Tobacco Use  . Smoking status: Never Smoker  . Smokeless tobacco: Never Used  Substance and Sexual Activity  . Alcohol use: No    Frequency: Never  . Drug use: No  . Sexual activity: Never  Lifestyle  . Physical activity:    Days per week: Not on file     Minutes per session: Not on file  . Stress: Not on file  Relationships  . Social connections:    Talks on phone: Not on file    Gets together: Not on file    Attends religious service: Not on file    Active member of club or organization: Not on file    Attends meetings of clubs or organizations: Not on file    Relationship status: Not on file  Other Topics Concern  . Not on file  Social History Narrative  . Not on file    Family History: Family History  Problem Relation Age of Onset  . Ovarian cysts Mother     Allergies: Allergies  Allergen Reactions  . Penicillins Itching and Rash    Has patient had a PCN reaction causing immediate rash, facial/tongue/throat swelling, SOB or lightheadedness with hypotension: Yes Has patient had a PCN reaction causing severe rash involving mucus membranes or skin necrosis: Yes Has patient had a PCN reaction that required hospitalization: No Has patient had a PCN reaction occurring within the last 10 years: No If all of  the above answers are "NO", then may proceed with Cephalosporin use.    Medications Prior to Admission  Medication Sig Dispense Refill Last Dose  . Prenatal Vit-Fe Fumarate-FA (PRENATAL VITAMIN PO) Take by mouth.   11/28/2017 at Unknown time     Review of Systems   All systems reviewed and negative except as stated in HPI  Blood pressure 120/65, pulse (!) 107, temperature 98.3 F (36.8 C), temperature source Oral, resp. rate 16, height 5' (1.524 m), weight 73.5 kg (162 lb), last menstrual period 03/09/2017. General appearance: alert, cooperative and moderate distress Lungs: clear to auscultation bilaterally Heart: regular rate and rhythm Abdomen: soft, non-tender; bowel sounds normal Extremities: no sign of DVT Presentation: cephalic Fetal monitoringBaseline: 130 bpm, Variability: Good {> 6 bpm), Accelerations: Reactive and Decelerations: Early Uterine activityFrequency: Every 5 minutes, Duration: 60 seconds and  Intensity: moderate Dilation: 5 Effacement (%): 90 Station: -2 Exam by:: Ferne CoeS Earl RNC   Prenatal labs: ABO, Rh: O/Positive/-- (12/28 0000) Antibody: Negative (12/28 0000) Rubella: Immune (12/28 0000) RPR: Nonreactive (12/28 0000)  HBsAg: Negative (12/28 0000)  HIV: Non-reactive (12/28 0000)  GBS: Negative (06/25 0000)  1 hr Glucola 100 Genetic screening  neg Anatomy US normal  Prenatal Transfer Tool  Maternal Diabetes: No Genetic Screening: Normal Maternal Ultrasounds/Referrals: Normal Fetal Ultrasounds or other Referrals:  None Maternal Substance Abuse:  No Significant Maternal Medications:  None Significant Maternal Lab Results: Lab values include: Group B Strep negative  Results for orders placed or performed during the hospital encounter of 11/28/17 (from the past 24 hour(s))  CBC   Collection Time: 11/28/17  5:27 PM  Result Value Ref Range   WBC 14.8 (H) 4.0 - 10.5 K/uL   RBC 4.85 3.87 - 5.11 MIL/uL   Hemoglobin 15.0 12.0 - 15.0 g/dL   HCT 16.142.5 09.636.0 - 04.546.0 %   MCV 87.6 78.0 - 100.0 fL   MCH 30.9 26.0 - 34.0 pg   MCHC 35.3 30.0 - 36.0 g/dL   RDW 40.913.8 81.111.5 - 91.415.5 %   Platelets 219 150 - 400 K/uL    Patient Active Problem List   Diagnosis Date Noted  . False labor 11/28/2017  . Normal labor 11/28/2017  . 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 G1P0 at 8292w2d here for spontaneous onset of labor.  Patient was admitted from MAU at 5.5 cm dilated with strong regular contractions.   #Labor:expectant management.  Membranes intact. Will augment with pitocin and AROM if necessary.   #Pain: No epidural #FWB: Cat I.  #ID:  GBS neg #MOF: breast and bottle #MOC:POPs  Sandre Kittyaniel K Olson, MD  11/28/2017, 6:34 PM  OB FELLOW HISTORY AND PHYSICAL ATTESTATION  I have seen and examined this patient; I agree with above documentation in the resident's note.    Frederik PearJulie P Degele, MD OB  Fellow 11/28/2017, 7:10 PM

## 2017-11-28 NOTE — MAU Provider Note (Signed)
Ms. Stephanie Quinn is a G1P0 at 165w2d seen in MAU for labor. RN labor check, not seen by provider. SVE by RN Dilation: 1.5 Effacement (%): 100 Cervical Position: Posterior Station: -3 Presentation: Vertex Exam by:: K. WeissRN  Unchanged cervical exam x 2 (1.5 hrs apart)  NST - FHR: 130 bpm / moderate variability / accels present / decels absent / TOCO: regular every 1-5 mins   Plan: False Labor Morphine 4 mg with Phenergan 25 mg IM injection 20 mins prior to discharge home D/C home with labor precautions Keep scheduled appt with [facility] next week  Raelyn Moraolitta Kiondre Grenz, CNM  11/28/2017 12:21 AM

## 2017-11-28 NOTE — Anesthesia Pain Management Evaluation Note (Signed)
  CRNA Pain Management Visit Note  Patient: Stephanie Quinn, 22 y.o., female  "Hello I am a [facility] the [facility]. We have an anesthesia team available at all times to provide care throughout the hospital, including epidural management and anesthesia for C-section. I don't know your plan for the delivery whether it a natural birth, water birth, IV sedation, nitrous supplementation, doula or epidural, but we want to meet your pain goals."   1.Was your pain managed to your expectations on prior hospitalizations?   No prior hospitalizations  2.What is your expectation for pain management during this hospitalization?     IV pain meds  3.How can we help you reach that goal?   Record the patient's initial score and the patient's pain goal.   Pain: 10  Pain Goal: 10 [facility] wants you to be able to say your pain was always managed very well.  Stephanie Quinn,Stephanie Quinn 11/28/2017

## 2017-11-28 NOTE — Anesthesia Procedure Notes (Signed)
Epidural Patient location during procedure: OB Start time: 11/28/2017 9:02 PM End time: 11/28/2017 9:06 PM  Staffing Anesthesiologist: Beryle LatheBrock, Tirso Laws E, MD Performed: anesthesiologist   Preanesthetic Checklist Completed: patient identified, pre-op evaluation, timeout performed, IV checked, risks and benefits discussed and monitors and equipment checked  Epidural Patient position: sitting Prep: DuraPrep Patient monitoring: continuous pulse ox and blood pressure Approach: midline Location: L2-L3 Injection technique: LOR saline  Needle:  Needle type: Tuohy  Needle gauge: 17 G Needle length: 9 cm Needle insertion depth: 6 cm Catheter size: 19 Gauge Catheter at skin depth: 11 cm Test dose: negative and Other (1% lidocaine)  Additional Notes Patient identified. Risks including, but not limited to, bleeding, infection, nerve damage, paralysis, inadequate analgesia, blood pressure changes, nausea, vomiting, allergic reaction, postpartum back pain, itching, and headache were discussed. Patient expressed understanding and wished to proceed. Sterile prep and drape, including hand hygiene, mask, and sterile gloves were used. The patient was positioned and the spine was prepped. The skin was anesthetized with lidocaine. No paraesthesia or other complication noted. The patient did not experience any signs of intravascular injection such as tinnitus or metallic taste in mouth, nor signs of intrathecal spread such as rapid motor block. Please see nursing notes for vital signs. The patient tolerated the procedure well.   Leslye Peerhomas Mercy Malena, MDReason for block:procedure for pain

## 2017-11-28 NOTE — Anesthesia Preprocedure Evaluation (Signed)
Anesthesia Evaluation  Patient identified by MRN, date of birth, ID band Patient awake    Reviewed: Allergy & Precautions, NPO status , Patient's Chart, lab work & pertinent test results  History of Anesthesia Complications Negative for: history of anesthetic complications  Airway Mallampati: II  TM Distance: >3 FB Neck ROM: Full    Dental  (+) Dental Advisory Given   Pulmonary neg pulmonary ROS,    breath sounds clear to auscultation       Cardiovascular negative cardio ROS   Rhythm:Regular Rate:Normal     Neuro/Psych negative neurological ROS  negative psych ROS   GI/Hepatic negative GI ROS, Neg liver ROS,   Endo/Other  negative endocrine ROS  Renal/GU negative Renal ROS  negative genitourinary   Musculoskeletal negative musculoskeletal ROS (+)   Abdominal   Peds  Hematology negative hematology ROS (+)   Anesthesia Other Findings   Reproductive/Obstetrics (+) Pregnancy                             Anesthesia Physical Anesthesia Plan  ASA: II  Anesthesia Plan: Epidural   Post-op Pain Management:    Induction:   PONV Risk Score and Plan:   Airway Management Planned: Natural Airway  Additional Equipment: None  Intra-op Plan:   Post-operative Plan:   Informed Consent: I have reviewed the patients History and Physical, chart, labs and discussed the procedure including the risks, benefits and alternatives for the proposed anesthesia with the patient or authorized representative who has indicated his/her understanding and acceptance.     Plan Discussed with: Anesthesiologist  Anesthesia Plan Comments: (Labs reviewed. Platelets acceptable, patient not taking any blood thinning medications. Risks and benefits discussed with patient, patient expressed understanding and wished to proceed.)        Anesthesia Quick Evaluation

## 2017-11-29 ENCOUNTER — Encounter (HOSPITAL_COMMUNITY): Payer: Self-pay

## 2017-11-29 DIAGNOSIS — Z3A39 39 weeks gestation of pregnancy: Secondary | ICD-10-CM

## 2017-11-29 LAB — RPR: RPR: NONREACTIVE

## 2017-11-29 MED ORDER — PRENATAL MULTIVITAMIN CH
1.0000 | ORAL_TABLET | Freq: Every day | ORAL | Status: DC
  Administered 2017-11-29 – 2017-11-30 (×2): 1 via ORAL
  Filled 2017-11-29 (×2): qty 1

## 2017-11-29 MED ORDER — COCONUT OIL OIL: 1.0000 "application " | TOPICAL_OIL | Status: DC | PRN

## 2017-11-29 MED ORDER — BENZOCAINE-MENTHOL 20-0.5 % EX AERO
1.0000 "application " | INHALATION_SPRAY | CUTANEOUS | Status: DC | PRN
  Administered 2017-11-29: 1 via TOPICAL
  Filled 2017-11-29: qty 56

## 2017-11-29 MED ORDER — ZOLPIDEM TARTRATE 5 MG PO TABS: 5.0000 mg | ORAL_TABLET | Freq: Every evening | ORAL | Status: DC | PRN

## 2017-11-29 MED ORDER — SIMETHICONE 80 MG PO CHEW: 80.0000 mg | CHEWABLE_TABLET | ORAL | Status: DC | PRN

## 2017-11-29 MED ORDER — TETANUS-DIPHTH-ACELL PERTUSSIS 5-2.5-18.5 LF-MCG/0.5 IM SUSP: 0.5000 mL | Freq: Once | INTRAMUSCULAR | Status: DC

## 2017-11-29 MED ORDER — WITCH HAZEL-GLYCERIN EX PADS: 1.0000 "application " | MEDICATED_PAD | CUTANEOUS | Status: DC | PRN

## 2017-11-29 MED ORDER — IBUPROFEN 600 MG PO TABS
600.0000 mg | ORAL_TABLET | Freq: Four times a day (QID) | ORAL | Status: DC
  Administered 2017-11-29 – 2017-11-30 (×6): 600 mg via ORAL
  Filled 2017-11-29 (×6): qty 1

## 2017-11-29 MED ORDER — DIBUCAINE 1 % RE OINT: 1.0000 "application " | TOPICAL_OINTMENT | RECTAL | Status: DC | PRN

## 2017-11-29 MED ORDER — SENNOSIDES-DOCUSATE SODIUM 8.6-50 MG PO TABS
2.0000 | ORAL_TABLET | ORAL | Status: DC
  Administered 2017-11-29: 2 via ORAL
  Filled 2017-11-29: qty 2

## 2017-11-29 MED ORDER — ACETAMINOPHEN 325 MG PO TABS: 650.0000 mg | ORAL_TABLET | ORAL | Status: DC | PRN

## 2017-11-29 MED ORDER — DIPHENHYDRAMINE HCL 25 MG PO CAPS: 25.0000 mg | ORAL_CAPSULE | Freq: Four times a day (QID) | ORAL | Status: DC | PRN

## 2017-11-29 MED ORDER — ONDANSETRON HCL 4 MG/2ML IJ SOLN: 4.0000 mg | INTRAMUSCULAR | Status: DC | PRN

## 2017-11-29 MED ORDER — ONDANSETRON HCL 4 MG PO TABS: 4.0000 mg | ORAL_TABLET | ORAL | Status: DC | PRN

## 2017-11-29 NOTE — Anesthesia Postprocedure Evaluation (Signed)
Anesthesia Post Note  Patient: Stephanie Quinn  Procedure(s) Performed: AN AD HOC LABOR EPIDURAL     Patient location during evaluation: Mother Baby Anesthesia Type: Epidural Level of consciousness: awake Pain management: satisfactory to patient Vital Signs Assessment: post-procedure vital signs reviewed and stable Respiratory status: spontaneous breathing Cardiovascular status: stable Anesthetic complications: no    Last Vitals:  Vitals:   11/29/17 0821 11/29/17 1300  BP: (!) 105/59 118/75  Pulse: 78 89  Resp: 18 17  Temp: 36.6 C 37 C  SpO2:      Last Pain:  Vitals:   11/29/17 1401  TempSrc:   PainSc: 0-No pain   Pain Goal:                 KeyCorpBURGER,Alfredo Collymore

## 2017-11-29 NOTE — Progress Notes (Signed)
Patient speaks limited English, her mother/ support person is present and says she can interpret for the patient. The patient agrees with the family member. Declined use of hospital interpreter. Assisted mother with breastfeeding and to the bathroom. Breakfast was ordered by family.

## 2017-11-29 NOTE — Lactation Note (Signed)
This note was copied from a baby's chart. Lactation Consultation Note  Patient Name: Stephanie Quinn ACZYS'AToday's Date: 11/29/2017 Reason for consult: Term;[facility] Breastfeeding consultation services and support information given to patient.  Baby is currently latched to right breast with good depth.  Baby is actively sucking with audible swallows.  Nipples are flat and mom has a manual pump to pre pump.  Shells given to wear between feedings.  Mom states she has been leaking milk for 4 months.  Instructed to feed baby with cues and call for assist prn.  Maternal Data Has patient been taught Hand Expression?: Yes Does the patient have breastfeeding experience prior to this delivery?: No  Feeding Feeding Type: Breast Fed  LATCH Score Latch: Repeated attempts needed to sustain latch, nipple held in mouth throughout feeding, stimulation needed to elicit sucking reflex.  Audible Swallowing: Spontaneous and intermittent  Type of Nipple: Flat  Comfort (Breast/Nipple): Filling, red/small blisters or bruises, mild/mod discomfort  Hold (Positioning): Assistance needed to correctly position infant at breast and maintain latch.  LATCH Score: 6  Interventions Interventions: Breast feeding basics reviewed;Breast massage;Breast compression;Adjust position;Hand pump  Lactation Tools Discussed/Used Tools: Nipple Shields(infant pulling away from nipple shield, prefers direct breas)   Consult Status Consult Status: Follow-up Date: 11/30/17 Follow-up type: In-patient    Huston FoleyMOULDEN, Stephanie Duan S 11/29/2017, 2:38 PM

## 2017-11-30 NOTE — Discharge Summary (Signed)
OB Discharge Summary     Patient Name: Stephanie Quinn DOB: 04/29/1996 MRN: [id number]  Date of admission: 11/28/2017 Delivering MD: Stephanie Quinn, Stephanie Quinn  and Stephanie Quinn CNM   Date of discharge: 11/30/2017  Admitting diagnosis: LABOR Intrauterine pregnancy: 4674w3d     Secondary diagnosis:  Active Problems:   Normal labor   SVD (spontaneous vaginal delivery)  Additional problems: Septate uterus      Discharge diagnosis: Term Pregnancy Delivered                                                                                                Post partum procedures:none  Augmentation: AROM  Complications: None  Hospital course:  Onset of Labor With Vaginal Delivery     22 y.o. yo G1P1001 at 774w3d was admitted in Active Labor on 11/28/2017. Patient had an uncomplicated labor course as follows:  Membrane Rupture Time/Date: 8:38 PM ,11/28/2017   Intrapartum Procedures: Episiotomy: None [1]                                         Lacerations:  Labial [10]  Patient had a delivery of a Viable infant. 11/29/2017  Information for the patient's newborn:  Stephanie Quinn, Girl Stephanie Quinn [[id number]][[id number]]       Pateint had an uncomplicated postpartum course.  She is ambulating, tolerating a regular diet, passing flatus, and urinating well. Patient is discharged home in stable condition on 11/30/17.   Physical exam  Vitals:   11/29/17 0821 11/29/17 1300 11/29/17 1856 11/29/17 2328  BP: (!) 105/59 118/75 104/64 102/62  Pulse: 78 89 74 66  Resp: 18 17 18 18   Temp: 97.8 F (36.6 C) 98.6 F (37 C) 98.3 F (36.8 C) 98.5 F (36.9 C)  TempSrc: Oral Oral Oral Oral  SpO2:      Weight:      Height:       General: alert, cooperative and no distress Lochia: appropriate Uterine Fundus: firm Incision: N/A DVT Evaluation: No evidence of DVT seen on physical exam. No cords or calf tenderness. No significant calf/ankle edema. Labs: Lab Results  Component Value Date   WBC 14.8 (H) 11/28/2017    HGB 15.0 11/28/2017   HCT 42.5 11/28/2017   MCV 87.6 11/28/2017   PLT 219 11/28/2017   No flowsheet data found.  Discharge instruction: per After Visit Summary and "Baby and Me Booklet".  After visit meds:  Allergies as of 11/30/2017      Reactions   Penicillins Itching, Rash   Has patient had a PCN reaction causing immediate rash, facial/tongue/throat swelling, SOB or lightheadedness with hypotension: Yes Has patient had a PCN reaction causing severe rash involving mucus membranes or skin necrosis: Yes Has patient had a PCN reaction that required hospitalization: No Has patient had a PCN reaction occurring within the last 10 years: No If all of the above answers are "NO", then may proceed with Cephalosporin use.      Medication List    TAKE  these medications   PRENATAL VITAMIN PO Take by mouth.       Diet: routine diet  Activity: Advance as tolerated. Pelvic rest for 6 weeks.   Outpatient follow up:4 weeks  Follow up Appt:No future appointments. Follow up Visit:No follow-ups on file.  Postpartum contraception: Progesterone only pills  Newborn Data: Live born female  Birth Weight: 6 lb 4.9 oz (2860 g) APGAR: 9, 9  Newborn Delivery   Birth date/time:  11/29/2017 00:11:00 Delivery type:  Vaginal, Spontaneous     Baby Feeding: Bottle and Breast Disposition:home with mother   11/30/2017 Stephanie Quinn, CNM

## 2017-12-01 ENCOUNTER — Ambulatory Visit: Payer: Self-pay

## 2017-12-01 NOTE — Lactation Note (Signed)
This note was copied from a baby's chart. Lactation Consultation Note  Patient Name: Stephanie Quinn [id number] Date: 12/01/2017 Reason for consult: Follow-up assessment;1st time breastfeeding;Infant < 6lbs;Early term 37-38.6wks   Follow up with first time mom of 50 hour old infant. Dad served as Equities trader. Infant with 7 BF for 10-15 minutes, formula x 3 of 10-20 cc, 6 voids and 6 stools in the last 24 hours. Infant weight 5 pounds 15.6 ounces with weight loss of 5%. LATCH score 7.   Mom was holding infant and infant cueing to feed. Mom reports infant had a bottle of 20 cc formula at 10:06 am. Enc mom to feed infant at the breast. Mom latched her to the left breast in the cross cradle hold. Infant latched well with flanged lips. Nipple is flat to inverted. Infant latched and fed eagerly with good swallows and breast softening noted. Breasts are feeling full, although not engorged. Mom needed some assistance with positioning and pillow support.   Mom has been offering the bottle prior to BF. Enc mom to offer both breasts prior to offering bottle. Milk was dripping from both breasts and her breast pads were completely soaked. Enc mom to pump using the DEBP or manual pump to use her milk to supplement infant. Mom was able to pump with DEBP for 15 minutes and obtain 20 cc milk. Milk was capped to be used at the next feeding. Mom informed to take all pump parts home with her and to take her manual pump home for pumping. Mom reports she is not active with [facility] and is awaiting approval from Medicaid. She was given [facility] phone # and encouraged to call them for services and for pump if able.   Reviewed with parents that due to infant size, she needs to feed STS 8-12 x in 24 hours at first feeding cues with no longer than 3 hours between feedings until she is back to birthweight. Enc mom to feed infant sts and to stimulate infant as needed with feeding and to massage/compress breast with feeding.   DEBp  was set up with instructions for assembling, disassembling and cleaning of pump parts. Showed mom how to use on Initiate setting. Mom did reports some pain with pumping so pump suction was turned down to minimum.   Reviewed engorgement prevention with mom and enc her to BF with each feeding prior to formula. Reviewed supply and demand and milk coming to volume. Enc mom to pump and offer infant her EBM post BF when available.   Reviewed I/O, Signs of dehydration in the infant, Engorgement prevention/treatment, signs infant getting enough to eat, and breast milk expression and storage. Enc parents to maintain feeding log and take to Ped.   [facility] Brochure reviewed, mom aware of OP services, BF Support Groups and LC phone #. Parents report they have a follow up appt for infant in 4 weeks, I did find their appointment reminder in their folder and informed them the infant has a follow up Ped appt tomorrow and the importance of taking infant to the appt.   Mom and dad report they have no question/concerns at this time. Mom to call out for feeding assistance as needed. Report to Bethann Humble, NP and Theressa Stamps, RN.    Maternal Data Formula Feeding for Exclusion: Yes Reason for exclusion: Mother's choice to formula and breast feed on admission Has patient been taught Hand Expression?: Yes Does the patient have breastfeeding experience prior to this delivery?: No  Feeding Feeding Type: Breast Fed Length of feed: 15 min  LATCH Score Latch: Grasps breast easily, tongue down, lips flanged, rhythmical sucking.  Audible Swallowing: Spontaneous and intermittent  Type of Nipple: Flat(left nipple flat with divoted center, right nipple short shaft)  Comfort (Breast/Nipple): Filling, red/small blisters or bruises, mild/mod discomfort  Hold (Positioning): Assistance needed to correctly position infant at breast and maintain latch.  LATCH Score: 7  Interventions Interventions: Breast feeding basics  reviewed;Support pillows;Assisted with latch;Position options;Skin to skin;Breast massage;Adjust position;Breast compression;Hand express;Hand pump;DEBP;Expressed milk  Lactation Tools Discussed/Used Tools: Pump Breast pump type: Double-Electric Breast Pump;Manual WIC Program: No(mom reports she is applying for Medicaid, she was unsure if she was active with WIC, mom has phone # to call them) Pump Review: Setup, frequency, and cleaning;Milk Storage Initiated by:: Reviewed and encouraged post BF to use to supplement infant   Consult Status Consult Status: Complete Follow-up type: Call as needed    Ed BlalockSharon S Shaindel Sweeten 12/01/2017, 12:19 PM

## 2018-05-10 NOTE — L&D Delivery Note (Signed)
Delivery Note At 2:45 PM a viable female was delivered via Vaginal, Spontaneous (Presentation: Vertex; LOA). APGAR: 8, 9; weight see delivery summary.   Placenta status: Spontaneous, intact.  Cord: 3 vessel with the following complications: none. Not able to collect placental cord blood due to lack of supply from the source.  Anesthesia: Epidural Episiotomy: None Lacerations: None  Suture Repair: none Est. Blood Loss (mL):  100  Mom to postpartum.  Baby to Couplet care / Skin to Skin.  Gerlene Fee, DO Family Medicine Resident, PGY-1 11/30/2018, 3:19 PM

## 2018-06-12 LAB — OB RESULTS CONSOLE RPR: RPR: NONREACTIVE

## 2018-06-12 LAB — OB RESULTS CONSOLE HEPATITIS B SURFACE ANTIGEN: Hepatitis B Surface Ag: NEGATIVE

## 2018-06-12 LAB — OB RESULTS CONSOLE GC/CHLAMYDIA
Chlamydia: NEGATIVE
Gonorrhea: NEGATIVE

## 2018-06-12 LAB — OB RESULTS CONSOLE HIV ANTIBODY (ROUTINE TESTING): HIV: NONREACTIVE

## 2018-06-12 LAB — OB RESULTS CONSOLE RUBELLA ANTIBODY, IGM: Rubella: IMMUNE

## 2018-11-08 ENCOUNTER — Other Ambulatory Visit (HOSPITAL_COMMUNITY): Payer: Self-pay | Admitting: Family

## 2018-11-08 DIAGNOSIS — Z3A36 36 weeks gestation of pregnancy: Secondary | ICD-10-CM

## 2018-11-08 DIAGNOSIS — Z3689 Encounter for other specified antenatal screening: Secondary | ICD-10-CM

## 2018-11-09 ENCOUNTER — Other Ambulatory Visit: Payer: Self-pay

## 2018-11-09 ENCOUNTER — Ambulatory Visit (HOSPITAL_COMMUNITY)
Admission: RE | Admit: 2018-11-09 | Discharge: 2018-11-09 | Disposition: A | Discharge status: Home / Self Care | Payer: Medicaid Other | Source: Ambulatory Visit | Attending: Obstetrics and Gynecology | Admitting: Obstetrics and Gynecology

## 2018-11-09 DIAGNOSIS — Z3A36 36 weeks gestation of pregnancy: Secondary | ICD-10-CM | POA: Diagnosis present

## 2018-11-09 DIAGNOSIS — Z3689 Encounter for other specified antenatal screening: Secondary | ICD-10-CM | POA: Insufficient documentation

## 2018-11-15 LAB — OB RESULTS CONSOLE GBS: GBS: NEGATIVE

## 2018-11-30 ENCOUNTER — Inpatient Hospital Stay (HOSPITAL_COMMUNITY): Payer: Medicaid Other | Admitting: Anesthesiology

## 2018-11-30 ENCOUNTER — Encounter (HOSPITAL_COMMUNITY): Payer: Self-pay | Admitting: *Deleted

## 2018-11-30 ENCOUNTER — Inpatient Hospital Stay (HOSPITAL_COMMUNITY)
Admission: AD | Admit: 2018-11-30 | Discharge: 2018-12-02 | DRG: 807 | Disposition: A | Discharge status: Home / Self Care | Payer: Medicaid Other | Attending: Obstetrics and Gynecology | Admitting: Obstetrics and Gynecology

## 2018-11-30 ENCOUNTER — Other Ambulatory Visit: Payer: Self-pay

## 2018-11-30 DIAGNOSIS — O09893 Supervision of other high risk pregnancies, third trimester: Secondary | ICD-10-CM

## 2018-11-30 DIAGNOSIS — Q513 Bicornate uterus: Secondary | ICD-10-CM

## 2018-11-30 DIAGNOSIS — Z1159 Encounter for screening for other viral diseases: Secondary | ICD-10-CM | POA: Diagnosis not present

## 2018-11-30 DIAGNOSIS — Z3A40 40 weeks gestation of pregnancy: Secondary | ICD-10-CM

## 2018-11-30 DIAGNOSIS — Z789 Other specified health status: Secondary | ICD-10-CM | POA: Diagnosis present

## 2018-11-30 DIAGNOSIS — Q512 Other doubling of uterus, unspecified: Secondary | ICD-10-CM | POA: Diagnosis not present

## 2018-11-30 DIAGNOSIS — D649 Anemia, unspecified: Secondary | ICD-10-CM | POA: Diagnosis present

## 2018-11-30 DIAGNOSIS — O9902 Anemia complicating childbirth: Secondary | ICD-10-CM | POA: Diagnosis present

## 2018-11-30 DIAGNOSIS — O26893 Other specified pregnancy related conditions, third trimester: Secondary | ICD-10-CM | POA: Diagnosis present

## 2018-11-30 DIAGNOSIS — O3403 Maternal care for unspecified congenital malformation of uterus, third trimester: Secondary | ICD-10-CM | POA: Diagnosis present

## 2018-11-30 DIAGNOSIS — O480 Post-term pregnancy: Secondary | ICD-10-CM | POA: Diagnosis not present

## 2018-11-30 LAB — CBC
HCT: 39.6 % (ref 36.0–46.0)
Hemoglobin: 13.7 g/dL (ref 12.0–15.0)
MCH: 30.3 pg (ref 26.0–34.0)
MCHC: 34.6 g/dL (ref 30.0–36.0)
MCV: 87.6 fL (ref 80.0–100.0)
Platelets: 213 10*3/uL (ref 150–400)
RBC: 4.52 MIL/uL (ref 3.87–5.11)
RDW: 13.4 % (ref 11.5–15.5)
WBC: 11.4 10*3/uL — ABNORMAL HIGH (ref 4.0–10.5)
nRBC: 0 % (ref 0.0–0.2)

## 2018-11-30 LAB — TYPE AND SCREEN
ABO/RH(D): O POS
Antibody Screen: NEGATIVE

## 2018-11-30 LAB — ABO/RH: ABO/RH(D): O POS

## 2018-11-30 LAB — SARS CORONAVIRUS 2 BY RT PCR (HOSPITAL ORDER, PERFORMED IN ~~LOC~~ HOSPITAL LAB): SARS Coronavirus 2: NEGATIVE

## 2018-11-30 LAB — RPR: RPR Ser Ql: NONREACTIVE

## 2018-11-30 MED ORDER — PHENYLEPHRINE 40 MCG/ML (10ML) SYRINGE FOR IV PUSH (FOR BLOOD PRESSURE SUPPORT)
80.0000 ug | PREFILLED_SYRINGE | INTRAVENOUS | Status: DC | PRN
  Filled 2018-11-30: qty 10

## 2018-11-30 MED ORDER — OXYCODONE-ACETAMINOPHEN 5-325 MG PO TABS: 1.0000 | ORAL_TABLET | ORAL | Status: DC | PRN

## 2018-11-30 MED ORDER — SOD CITRATE-CITRIC ACID 500-334 MG/5ML PO SOLN: 30.0000 mL | ORAL | Status: DC | PRN

## 2018-11-30 MED ORDER — DIPHENHYDRAMINE HCL 50 MG/ML IJ SOLN: 12.5000 mg | INTRAMUSCULAR | Status: DC | PRN

## 2018-11-30 MED ORDER — LIDOCAINE HCL (PF) 1 % IJ SOLN
INTRAMUSCULAR | Status: DC | PRN
  Administered 2018-11-30 (×2): 4 mL via EPIDURAL

## 2018-11-30 MED ORDER — ACETAMINOPHEN 325 MG PO TABS
650.0000 mg | ORAL_TABLET | ORAL | Status: DC | PRN
  Administered 2018-12-01 – 2018-12-02 (×3): 650 mg via ORAL
  Filled 2018-11-30 (×3): qty 2

## 2018-11-30 MED ORDER — EPHEDRINE 5 MG/ML INJ: 10.0000 mg | INTRAVENOUS | Status: DC | PRN

## 2018-11-30 MED ORDER — ONDANSETRON HCL 4 MG/2ML IJ SOLN
4.0000 mg | Freq: Four times a day (QID) | INTRAMUSCULAR | Status: DC | PRN
  Administered 2018-11-30: 13:00:00 4 mg via INTRAVENOUS
  Filled 2018-11-30: qty 2

## 2018-11-30 MED ORDER — ACETAMINOPHEN 325 MG PO TABS: 650.0000 mg | ORAL_TABLET | ORAL | Status: DC | PRN

## 2018-11-30 MED ORDER — PRENATAL MULTIVITAMIN CH
1.0000 | ORAL_TABLET | Freq: Every day | ORAL | Status: DC
  Administered 2018-12-01 – 2018-12-02 (×2): 1 via ORAL
  Filled 2018-11-30 (×2): qty 1

## 2018-11-30 MED ORDER — LACTATED RINGERS IV SOLN
INTRAVENOUS | Status: DC
  Administered 2018-11-30 (×2): via INTRAVENOUS

## 2018-11-30 MED ORDER — OXYCODONE-ACETAMINOPHEN 5-325 MG PO TABS: 2.0000 | ORAL_TABLET | ORAL | Status: DC | PRN

## 2018-11-30 MED ORDER — OXYTOCIN 40 UNITS IN NORMAL SALINE INFUSION - SIMPLE MED
2.5000 [IU]/h | INTRAVENOUS | Status: DC
  Filled 2018-11-30: qty 1000

## 2018-11-30 MED ORDER — FENTANYL CITRATE (PF) 100 MCG/2ML IJ SOLN
100.0000 ug | INTRAMUSCULAR | Status: DC | PRN
  Administered 2018-11-30: 08:00:00 100 ug via INTRAVENOUS

## 2018-11-30 MED ORDER — TETANUS-DIPHTH-ACELL PERTUSSIS 5-2.5-18.5 LF-MCG/0.5 IM SUSP: 0.5000 mL | Freq: Once | INTRAMUSCULAR | Status: DC

## 2018-11-30 MED ORDER — SENNOSIDES-DOCUSATE SODIUM 8.6-50 MG PO TABS
2.0000 | ORAL_TABLET | ORAL | Status: DC
  Administered 2018-12-01 (×2): 2 via ORAL
  Filled 2018-11-30 (×2): qty 2

## 2018-11-30 MED ORDER — PHENYLEPHRINE 40 MCG/ML (10ML) SYRINGE FOR IV PUSH (FOR BLOOD PRESSURE SUPPORT): 80.0000 ug | PREFILLED_SYRINGE | INTRAVENOUS | Status: DC | PRN

## 2018-11-30 MED ORDER — LACTATED RINGERS IV SOLN: 500.0000 mL | INTRAVENOUS | Status: DC | PRN

## 2018-11-30 MED ORDER — LIDOCAINE HCL (PF) 1 % IJ SOLN: 30.0000 mL | INTRAMUSCULAR | Status: DC | PRN

## 2018-11-30 MED ORDER — TERBUTALINE SULFATE 1 MG/ML IJ SOLN: 0.2500 mg | Freq: Once | INTRAMUSCULAR | Status: DC | PRN

## 2018-11-30 MED ORDER — DIBUCAINE (PERIANAL) 1 % EX OINT: 1.0000 "application " | TOPICAL_OINTMENT | CUTANEOUS | Status: DC | PRN

## 2018-11-30 MED ORDER — SODIUM CHLORIDE (PF) 0.9 % IJ SOLN
INTRAMUSCULAR | Status: DC | PRN
  Administered 2018-11-30: 12 mL/h via EPIDURAL

## 2018-11-30 MED ORDER — IBUPROFEN 600 MG PO TABS
600.0000 mg | ORAL_TABLET | Freq: Four times a day (QID) | ORAL | Status: DC
  Administered 2018-11-30 – 2018-12-02 (×8): 600 mg via ORAL
  Filled 2018-11-30 (×8): qty 1

## 2018-11-30 MED ORDER — FENTANYL-BUPIVACAINE-NACL 0.5-0.125-0.9 MG/250ML-% EP SOLN
12.0000 mL/h | EPIDURAL | Status: DC | PRN
  Filled 2018-11-30: qty 250

## 2018-11-30 MED ORDER — FENTANYL-BUPIVACAINE-NACL 0.5-0.125-0.9 MG/250ML-% EP SOLN: 12.0000 mL/h | EPIDURAL | Status: DC | PRN

## 2018-11-30 MED ORDER — ZOLPIDEM TARTRATE 5 MG PO TABS: 5.0000 mg | ORAL_TABLET | Freq: Every evening | ORAL | Status: DC | PRN

## 2018-11-30 MED ORDER — COCONUT OIL OIL
1.0000 "application " | TOPICAL_OIL | Status: DC | PRN
  Administered 2018-12-02: 1 via TOPICAL

## 2018-11-30 MED ORDER — DIPHENHYDRAMINE HCL 25 MG PO CAPS: 25.0000 mg | ORAL_CAPSULE | Freq: Four times a day (QID) | ORAL | Status: DC | PRN

## 2018-11-30 MED ORDER — FENTANYL CITRATE (PF) 100 MCG/2ML IJ SOLN
INTRAMUSCULAR | Status: AC
  Filled 2018-11-30: qty 2

## 2018-11-30 MED ORDER — SIMETHICONE 80 MG PO CHEW: 80.0000 mg | CHEWABLE_TABLET | ORAL | Status: DC | PRN

## 2018-11-30 MED ORDER — LACTATED RINGERS IV SOLN: 500.0000 mL | Freq: Once | INTRAVENOUS | Status: DC

## 2018-11-30 MED ORDER — ONDANSETRON HCL 4 MG PO TABS: 4.0000 mg | ORAL_TABLET | ORAL | Status: DC | PRN

## 2018-11-30 MED ORDER — OXYTOCIN 40 UNITS IN NORMAL SALINE INFUSION - SIMPLE MED
1.0000 m[IU]/min | INTRAVENOUS | Status: DC
  Administered 2018-11-30: 14:00:00 2 m[IU]/min via INTRAVENOUS

## 2018-11-30 MED ORDER — WITCH HAZEL-GLYCERIN EX PADS: 1.0000 "application " | MEDICATED_PAD | CUTANEOUS | Status: DC | PRN

## 2018-11-30 MED ORDER — BENZOCAINE-MENTHOL 20-0.5 % EX AERO
1.0000 "application " | INHALATION_SPRAY | CUTANEOUS | Status: DC | PRN
  Administered 2018-12-01: 1 via TOPICAL
  Filled 2018-11-30: qty 56

## 2018-11-30 MED ORDER — OXYTOCIN BOLUS FROM INFUSION
500.0000 mL | Freq: Once | INTRAVENOUS | Status: AC
  Administered 2018-11-30: 15:00:00 500 mL via INTRAVENOUS

## 2018-11-30 MED ORDER — ONDANSETRON HCL 4 MG/2ML IJ SOLN: 4.0000 mg | INTRAMUSCULAR | Status: DC | PRN

## 2018-11-30 NOTE — H&P (Signed)
Stephanie Quinn is a 23 y.o. female presenting for painful uterine contractions since 2200hrs. Started having bloody show this morning.  Denies leaking fluids  [facility]  Patient Active Problem List   Diagnosis Date Noted  . Anemia 11/30/2018  . Language barrier 11/30/2018  . Short interval between pregnancies affecting pregnancy in third trimester, antepartum 11/30/2018  . 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 OB History    Gravida  2   Para  1   Term  1   Preterm      AB      Living  1     SAB      TAB      Ectopic      Multiple  0   Live Births  1          Past Medical History:  Diagnosis Date  . Medical history non-contributory    Past Surgical History:  Procedure Laterality Date  . NO PAST SURGERIES    . WISDOM TOOTH EXTRACTION     Family History: family history includes Ovarian cysts in her mother. Social History:  reports that she has never smoked. She has never used smokeless tobacco. She reports that she does not drink alcohol or use drugs.     Maternal Diabetes: No Genetic Screening: Declined Maternal Ultrasounds/Referrals: Normal Fetal Ultrasounds or other Referrals:  None Maternal Substance Abuse:  No Significant Maternal Medications:  None Significant Maternal Lab Results:  Group B Strep negative Other Comments:  Late to care, short interval between pregnancies  Review of Systems  Constitutional: Negative for chills and fever.  Respiratory: Negative for shortness of breath.   Cardiovascular: Negative for leg swelling.  Gastrointestinal: Positive for abdominal pain. Negative for constipation, diarrhea, nausea and vomiting.  Neurological: Negative for headaches.   Maternal Medical History:  Reason for admission: Contractions.  Nausea.  Contractions: Onset was 6-12 hours ago.   Frequency: regular.   Perceived severity is  moderate.    Fetal activity: Perceived fetal activity is normal.   Last perceived fetal movement was within the past hour.    Prenatal complications: No bleeding, PIH, placental abnormality, pre-eclampsia or preterm labor.   Prenatal Complications - Diabetes: none.    Dilation: 6.5 Effacement (%): 70 Station: -1 Exam by:: Blima Singer RNC Blood pressure 134/80, pulse (!) 112, temperature 98 F (36.7 C), resp. rate 20, weight 70.3 kg, unknown if currently breastfeeding. Maternal Exam:  Uterine Assessment: Contraction strength is moderate.  Contraction frequency is regular.   Abdomen: Patient reports no abdominal tenderness. Estimated fetal weight is 7lb.   Fetal presentation: vertex  Introitus: Normal vulva. Normal vagina.  Ferning test: not done.  Nitrazine test: not done. Amniotic fluid character: not assessed.  Pelvis: adequate for delivery.   Cervix: Cervix evaluated by digital exam.     Fetal Exam Fetal Monitor Review: Mode: ultrasound.   Baseline rate: 140.  Variability: moderate (6-25 bpm).   Pattern: accelerations present and no decelerations.    Fetal State Assessment: Category I - tracings are normal.     Physical Exam  Constitutional: She is oriented to person, place, and time. She appears well-developed and well-nourished. No distress.  HENT:  Head: Normocephalic.  Cardiovascular: Normal rate and regular rhythm.  Respiratory: Effort normal. No respiratory distress.  GI: Soft. She exhibits no distension. There is no abdominal tenderness. There is no rebound and  no guarding.  Genitourinary:    Vulva normal.     Genitourinary Comments: Dilation: 6.5 Effacement (%): 70 Cervical Position: Anterior Station: -1 Presentation: Vertex Exam by:: Quintella BatonJo Barham RNC    Musculoskeletal: Normal range of motion.  Neurological: She is alert and oriented to person, place, and time.  Skin: Skin is warm and dry.  Psychiatric: She has a normal mood and affect.    Prenatal  labs: ABO, Rh:   Antibody:   Rubella:   RPR:    HBsAg:    HIV:    GBS: Negative (07/08 0000)   Assessment/Plan: Single intrauterine pregnancy at 3131w1d Active Labor GBS neg  Admit to Labor and Delivery Routine orders Epidural (requested by pt) Anticipate SVD   Wynelle BourgeoisMarie Enora Trillo 11/30/2018, 7:15 AM

## 2018-11-30 NOTE — Progress Notes (Signed)
LABOR PROGRESS NOTE  Stephanie Quinn is a 23 y.o. G2P1001 at [redacted]w[redacted]d  admitted for SOL.  Subjective: She is doing well and resting in bed. She is experiencing mild discomfort with contractions.  Objective: BP 112/63   Pulse 75   Temp 99 F (37.2 C) (Oral)   Resp 16   Ht 5' (1.524 m)   Wt 70.3 kg   BMI 30.27 kg/m  or  Vitals:   11/30/18 1231 11/30/18 1301 11/30/18 1330 11/30/18 1404  BP: 118/73 112/72 114/75 112/63  Pulse: 78 69 65 75  Resp: 18 16 14 16   Temp:  99 F (37.2 C)    TempSrc:  Oral    Weight:      Height:         Dilation: 8 Effacement (%): 80 Cervical Position: Anterior Station: -1 Presentation: Vertex Exam by:: Dr. Janus Molder FHT: baseline rate 130, moderate varibility, 15 x15 acels, no decels Toco: 2-4 mins  Labs: Lab Results  Component Value Date   WBC 11.4 (H) 11/30/2018   HGB 13.7 11/30/2018   HCT 39.6 11/30/2018   MCV 87.6 11/30/2018   PLT 213 11/30/2018    Patient Active Problem List   Diagnosis Date Noted  . Anemia 11/30/2018  . Language barrier 11/30/2018  . Short interval between pregnancies affecting pregnancy in third trimester, antepartum 11/30/2018  . 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 G2P1001 at [redacted]w[redacted]d here for SOL.  Labor: Cervical exam is unchanged from last exam, start Pitocin 54mU/min. Another forebag was found on exam and artificially ruptured. Fetal Wellbeing: Cat I and vertex, sutures palpated on exam Pain Control:  Epidural in place and adequate Anticipated MOD: Vaginal  Stephanie Quinn, D.O. Family Medicine Resident, PGY-1 11/30/2018, 2:09 PM

## 2018-11-30 NOTE — Anesthesia Preprocedure Evaluation (Signed)
Anesthesia Evaluation  Patient identified by MRN, date of birth, ID band Patient awake    Reviewed: Allergy & Precautions, NPO status , Patient's Chart, lab work & pertinent test results  History of Anesthesia Complications Negative for: history of anesthetic complications  Airway Mallampati: II  TM Distance: >3 FB Neck ROM: Full    Dental  (+) Dental Advisory Given   Pulmonary neg pulmonary ROS,    breath sounds clear to auscultation       Cardiovascular negative cardio ROS   Rhythm:Regular Rate:Normal     Neuro/Psych negative neurological ROS  negative psych ROS   GI/Hepatic negative GI ROS, Neg liver ROS,   Endo/Other  negative endocrine ROS  Renal/GU negative Renal ROS     Musculoskeletal negative musculoskeletal ROS (+)   Abdominal   Peds  Hematology  (+) anemia ,   Anesthesia Other Findings   Reproductive/Obstetrics (+) Pregnancy                             Anesthesia Physical  Anesthesia Plan  ASA: II  Anesthesia Plan: Epidural   Post-op Pain Management:    Induction:   PONV Risk Score and Plan:   Airway Management Planned: Natural Airway  Additional Equipment: None  Intra-op Plan:   Post-operative Plan:   Informed Consent: I have reviewed the patients History and Physical, chart, labs and discussed the procedure including the risks, benefits and alternatives for the proposed anesthesia with the patient or authorized representative who has indicated his/her understanding and acceptance.       Plan Discussed with:   Anesthesia Plan Comments: (Labs reviewed. Platelets acceptable, patient not taking any blood thinning medications. Risks and benefits discussed with patient, patient expressed understanding and wished to proceed.)        Anesthesia Quick Evaluation

## 2018-11-30 NOTE — MAU Note (Signed)
Ctxs since 2200. Bloody show. Denies LOF.

## 2018-11-30 NOTE — Lactation Note (Signed)
This note was copied from a baby's chart. Lactation Consultation Note  Patient Name: Girl Raima Geathers [id number] Date: 11/30/2018 Reason for consult: 1st time breastfeeding;Initial assessment;Term P2, 7 hour female infant. In house interpreter used # Tammi Klippel  Per mom, she did not breastfeed her [age] year old, mom has  (closed spaced pregnancies).  Per mom, she had  made a  few attempts to latch infant to breast infant last breastfeed at 3:32 pm today.  LC notice when mom taught back hand expression, she has flat and inverted nipples. LC gave mom breast shells to wear in bra during day and hand pump to pre-pump breast to help evert nipple shaft out more prior to latching infant to breast. Mom attempted latch infant on left breast after pre-pumping, infant  did not suckle  at this time but only held  breast in mouth. Mom gave infant 10 ml of EBM on spoon. Mom has additional 5 ml that she offer at next feeding after latching infant to breast. Infant  became spitty after burping (clear frothy mucous). Mom knows to breastfeed infant according hunger cues, 8 to12 times within 24 hours and on demand. Mom knows to hand express or use hand pump and give infant EBM if infant doesn't latch to breast. Mom did STS as LC and interpreter left room [place] discussed I & O. Reviewed Baby & Me book's Breastfeeding Basics.  Mom made aware of O/P services, breastfeeding support groups, community resources, and our phone # for post-discharge questions.  Maternal Data Formula Feeding for Exclusion: No Has patient been taught Hand Expression?: Yes(Mom hand expressed 15 ml of colostrum and infant was given 10 ml by spoon.)  Feeding Feeding Type: Breast Fed  LATCH Score Latch: Too sleepy or reluctant, no latch achieved, no sucking elicited.  Audible Swallowing: None  Type of Nipple: Inverted  Comfort (Breast/Nipple): Soft / non-tender  Hold (Positioning): Assistance needed to correctly position  infant at breast and maintain latch.  LATCH Score: 3  Interventions Interventions: Breast feeding basics reviewed;Assisted with latch;Skin to skin;Adjust position;Breast compression;Support pillows;Position options;Hand express;Pre-pump if needed;Hand pump;Expressed milk  Lactation Tools Discussed/Used Tools: Shells Shell Type: Inverted Pump Review: Setup, frequency, and cleaning;Milk Storage Initiated by:: Vicente Serene, IBCLC Date initiated:: 11/30/18   Consult Status Consult Status: Follow-up Date: 12/01/18 Follow-up type: In-patient    Vicente Serene 11/30/2018, 10:08 PM

## 2018-11-30 NOTE — Progress Notes (Signed)
LABOR PROGRESS NOTE  Stephanie Quinn is a 23 y.o. G2P1001 at [redacted]w[redacted]d  admitted for SOL.  Subjective: She is doing well resting in bed. She is currently experiencing mild discomfort with contractions.  Objective: BP 114/75   Pulse 65   Temp 99 F (37.2 C) (Oral)   Resp 14   Ht 5' (1.524 m)   Wt 70.3 kg   BMI 30.27 kg/m  or  Vitals:   11/30/18 1201 11/30/18 1231 11/30/18 1301 11/30/18 1330  BP: 116/76 118/73 112/72 114/75  Pulse: 77 78 69 65  Resp:  18 16 14   Temp:   99 F (37.2 C)   TempSrc:   Oral   Weight:      Height:         Dilation: 8 Effacement (%): 80 Cervical Position: Anterior Station: -1 Presentation: Vertex Exam by:: Dr. Janus Molder FHT: baseline rate 125, moderate varibility, 15 x 15 acels, no decels Toco: 3-4  Labs: Lab Results  Component Value Date   WBC 11.4 (H) 11/30/2018   HGB 13.7 11/30/2018   HCT 39.6 11/30/2018   MCV 87.6 11/30/2018   PLT 213 11/30/2018    Patient Active Problem List   Diagnosis Date Noted  . Anemia 11/30/2018  . Language barrier 11/30/2018  . Short interval between pregnancies affecting pregnancy in third trimester, antepartum 11/30/2018  . 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 G2P1001 at [redacted]w[redacted]d here for SOL.  Labor: AROM forebag. Continue cervical exam for the progression of labor. Consider starting pitocin 77mU/min. Fetal Wellbeing: Cat I, Vertex sutures palpated on cervical exam. Pain Control:  Epidural in place and adequate Anticipated MOD: Vaginal  Zakari Couchman Autry-Lott, D.O. Family Medicine Resident, PGY-1 11/30/2018, 1:42 PM

## 2018-11-30 NOTE — Progress Notes (Signed)
Hansel Feinstein CNM notified of pt's admission and status. Aware of ctx pattern, sve, strip reassuring but not reactive yet. Will admit to [facility]

## 2018-11-30 NOTE — Anesthesia Procedure Notes (Signed)
Epidural Patient location during procedure: OB Start time: 11/30/2018 8:49 AM End time: 11/30/2018 8:59 AM  Staffing Anesthesiologist: Nolon Nations, MD Performed: anesthesiologist   Preanesthetic Checklist Completed: patient identified, pre-op evaluation, timeout performed, IV checked, risks and benefits discussed and monitors and equipment checked  Epidural Patient position: sitting Prep: site prepped and draped and DuraPrep Patient monitoring: heart rate, continuous pulse ox and blood pressure Approach: midline Location: L2-L3 Injection technique: LOR air and LOR saline  Needle:  Needle type: Tuohy  Needle gauge: 17 G Needle length: 9 cm Needle insertion depth: 5 cm Catheter type: closed end flexible Catheter size: 19 Gauge Catheter at skin depth: 10 cm Test dose: negative  Assessment Sensory level: T8 Events: blood not aspirated, injection not painful, no injection resistance, negative IV test and no paresthesia  Additional Notes Reason for block:procedure for pain

## 2018-11-30 NOTE — Progress Notes (Signed)
Stephanie Quinn is a 23 y.o. G2P1001 at [redacted]w[redacted]d admitted for active labor.  Subjective: Patient comfortable with epidural, very tired and sleeping. Does not feel pressure. Per RN, SROM occurred right after epidural placed, forebag still present.  Objective: BP 110/76   Pulse 66   Temp 98.7 F (37.1 C) (Oral)   Resp 16   Ht 5' (1.524 m)   Wt 70.3 kg   BMI 30.27 kg/m  No intake/output data recorded. No intake/output data recorded.  FHT:  FHR: 125 bpm, variability: moderate,  accelerations:  Present,  decelerations:  Absent UC:   regular, every 3-4 minutes SVE:   Dilation: 5.5 Effacement (%): 100 Station: -2 Exam by:: Ignacia Felling, RN  Labs: Lab Results  Component Value Date   WBC 11.4 (H) 11/30/2018   HGB 13.7 11/30/2018   HCT 39.6 11/30/2018   MCV 87.6 11/30/2018   PLT 213 11/30/2018    Assessment / Plan: Spontaneous labor, progressing normally  Labor: Progressing normally Preeclampsia:  N/A Fetal Wellbeing:  Category I Pain Control:  Epidural I/D:  GBS Neg Anticipated MOD:  NSVD  Katherine Basset, DO 11/30/2018, 09:48AM

## 2018-12-01 ENCOUNTER — Encounter (HOSPITAL_COMMUNITY): Payer: Self-pay | Admitting: *Deleted

## 2018-12-01 LAB — CBC
HCT: 37.8 % (ref 36.0–46.0)
Hemoglobin: 12.5 g/dL (ref 12.0–15.0)
MCH: 30 pg (ref 26.0–34.0)
MCHC: 33.1 g/dL (ref 30.0–36.0)
MCV: 90.6 fL (ref 80.0–100.0)
Platelets: 194 10*3/uL (ref 150–400)
RBC: 4.17 MIL/uL (ref 3.87–5.11)
RDW: 13.7 % (ref 11.5–15.5)
WBC: 11.5 10*3/uL — ABNORMAL HIGH (ref 4.0–10.5)
nRBC: 0 % (ref 0.0–0.2)

## 2018-12-01 NOTE — Anesthesia Postprocedure Evaluation (Signed)
Anesthesia Post Note  Patient: Stephanie Quinn  Procedure(s) Performed: AN AD Hart     Patient location during evaluation: Mother Baby Anesthesia Type: Epidural Level of consciousness: awake and alert Pain management: pain level controlled Vital Signs Assessment: post-procedure vital signs reviewed and stable Respiratory status: spontaneous breathing, nonlabored ventilation and respiratory function stable Cardiovascular status: stable Postop Assessment: no headache, no backache, epidural receding, no apparent nausea or vomiting, patient able to bend at knees, adequate PO intake and able to ambulate Anesthetic complications: no    Last Vitals:  Vitals:   12/01/18 0055 12/01/18 0500  BP: 95/64 97/62  Pulse: (!) 56 (!) 56  Resp: 16 14  Temp: 36.7 C 36.5 C    Last Pain:  Vitals:   12/01/18 1100  TempSrc:   PainSc: 0-No pain   Pain Goal: Patients Stated Pain Goal: 0 (11/30/18 0635)                 Jabier Mutton

## 2018-12-01 NOTE — Progress Notes (Signed)
Post Partum Day 1 Subjective: no complaints, up ad lib, voiding and tolerating PO, small lochia, plans to breastfeed, Nexplanon  Objective: Blood pressure 97/62, pulse (!) 56, temperature 97.7 F (36.5 C), temperature source Oral, resp. rate 14, height 5' (1.524 m), weight 70.3 kg, unknown if currently breastfeeding.  Physical Exam:  General: alert, cooperative and no distress Lochia:normal flow Chest: CTAB Heart: RRR no m/r/g Abdomen: +BS, soft, nontender,  Uterine Fundus: firm DVT Evaluation: No evidence of DVT seen on physical exam. Extremities: trace edema  Recent Labs    11/30/18 0720 12/01/18 0523  HGB 13.7 12.5  HCT 39.6 37.8    Assessment/Plan: Plan for discharge tomorrow   LOS: 1 day   [name] 12/01/2018, 8:11 AM

## 2018-12-02 MED ORDER — IBUPROFEN 600 MG PO TABS: 600.0000 mg | ORAL_TABLET | Freq: Four times a day (QID) | ORAL | 1 refills | Status: DC

## 2018-12-02 NOTE — Discharge Summary (Signed)
OB Discharge Summary     Patient Name: Stephanie Quinn DOB: [birth year] MRN: [id number]  Date of admission: 11/30/2018 Delivering MD: Gerlene Fee   Date of discharge: 12/02/2018  Admitting diagnosis: 95WKS CTX Intrauterine pregnancy: [redacted]w[redacted]d     Secondary diagnosis:  Active Problems:   Anemia   Language barrier   Short interval between pregnancies affecting pregnancy in third trimester, antepartum   Vaginal delivery  Additional problems: Septate Uterus      Discharge diagnosis: Term Pregnancy Delivered                                                                                                Post partum procedures:None   Augmentation: AROM and Pitocin  Complications: None  Hospital course:  Onset of Labor With Vaginal Delivery     23 y.o. yo [id number] at [redacted]w[redacted]d was admitted in Active Labor on 11/30/2018. Patient had an uncomplicated labor course as follows:  Membrane Rupture Time/Date: 9:08 AM ,11/30/2018   Intrapartum Procedures: Episiotomy: None [1]                                         Lacerations:  None [1]  Patient had a delivery of a Viable infant. 11/30/2018  Information for the patient's newborn:  Ailis Rigaud, Girl Aoki [[id number]]  Delivery Method: Vag-Spont     Pateint had an uncomplicated postpartum course.  She is ambulating, tolerating a regular diet, passing flatus, and urinating well. Patient is discharged home in stable condition on 12/02/18.   Physical exam  Vitals:   12/01/18 0500 12/01/18 1559 12/01/18 2110 12/02/18 0619  BP: 97/62 103/67 107/64 109/65  Pulse: (!) 56 61 (!) 56 62  Resp: 14 16 20 16   Temp: 97.7 F (36.5 C) 97.8 F (36.6 C) 97.6 F (36.4 C) 97.7 F (36.5 C)  TempSrc: Oral Oral Oral Oral  SpO2:  100% 100%   Weight:      Height:       General: alert and cooperative Lochia: appropriate Uterine Fundus: firm Incision: N/A DVT Evaluation: No evidence of DVT seen on physical exam. Labs: Lab Results  Component  Value Date   WBC 11.5 (H) 12/01/2018   HGB 12.5 12/01/2018   HCT 37.8 12/01/2018   MCV 90.6 12/01/2018   PLT 194 12/01/2018   No flowsheet data found.  Discharge instruction: per After Visit Summary and "Baby and Me Booklet".  After visit meds:  Allergies as of 12/02/2018      Reactions   Penicillins Itching, Rash   Has patient had a PCN reaction causing immediate rash, facial/tongue/throat swelling, SOB or lightheadedness with hypotension: Yes Has patient had a PCN reaction causing severe rash involving mucus membranes or skin necrosis: Yes Has patient had a PCN reaction that required hospitalization: No Has patient had a PCN reaction occurring within the last 10 years: No If all of the above answers are "NO", then may proceed with Cephalosporin use.      Medication List  TAKE these medications   ibuprofen 600 MG tablet Commonly known as: ADVIL Take 1 tablet (600 mg total) by mouth every 6 (six) hours.   PRENATAL VITAMIN PO Take by mouth.       Diet: routine diet  Activity: Advance as tolerated. Pelvic rest for 6 weeks.   Outpatient follow up:4 weeks Follow up Appt:No future appointments. Follow up Visit:No follow-ups on file.  Postpartum contraception: Nexplanon  Newborn Data: Live born female  Birth Weight: 6 lb 10.2 oz (3010 g) APGAR: 9, 9  Newborn Delivery   Birth date/time: 11/30/2018 14:45:00 Delivery type: Vaginal, Spontaneous      Baby Feeding: Breast Disposition:home with mother   12/02/2018 De Hollingsheadatherine L Laneice Meneely, DO

## 2018-12-02 NOTE — Lactation Note (Signed)
This note was copied from a baby's chart. Lactation Consultation Note  Patient Name: Stephanie Quinn SEGBT'D Date: 12/02/2018   Stephanie Quinn, in-house interpreter was present for entire consult. Mom is a P2 and nursed her 1st baby for 2 mo. She reports having an abundant supply, but she quit at the 53-month mark b/c (from her description), she became engorged & developed mastitis. Mom then quit breastfeeding. Mom was cautioned, in the presence of an abundant supply, not to quit completely so as to avoid the occurrence of a possible abscess/worsening mastitis. Mom verbalized understanding.   Mom feels that only "a little bit of milk is coming out and then baby cries." However, infant has gained 25g in the presence of very little formula supplementation. On exam, Mom's breasts are noted to be filling. On further questioning, Mom notes that her breasts soften as infant feeds. I normalized this for her and commended her on how well she & "Stephanie Quinn" are doing.   Mom's nipples are atraumatic, except for a compression stripe on her R nipple. I explained that it is possible that infant's bottom lip is not the first point of contact with the latch. Specifics of an asymmetric latch were shown via [employer]. Mom agreed that her technique was not allowing infant's bottom lip to touch first.   Another point of clarification with Mom: with her 1st baby, she was instructed to feed infant at breast and to pump. In this situation, it is likely that the early pumping was an iatrogenic cause of oversupply (as Mom says she already "had milk" on the 1st postpartum day). I suggested that Mom try not to pump in the 1st 2 weeks unless she & infant are separated or she needs to relieve breast discomfort. Mom's questions were answered to her satisfaction & she knows how to reach [place] for any post-discharge questions.   Matthias Hughs [facility] 12/02/2018, 9:43 AM

## 2018-12-02 NOTE — Progress Notes (Deleted)
Post Partum Day 1 Subjective: no complaints  Objective: Blood pressure 109/65, pulse 62, temperature 97.7 F (36.5 C), temperature source Oral, resp. rate 16, height 5' (1.524 m), weight 70.3 kg, SpO2 100 %, unknown if currently breastfeeding.  Physical Exam:  General: alert Lochia: appropriate Uterine Fundus: firm Incision: healing well DVT Evaluation: No evidence of DVT seen on physical exam.  Recent Labs    11/30/18 0720 12/01/18 0523  HGB 13.7 12.5  HCT 39.6 37.8    Assessment/Plan: Stable For PP BTL today  Patient desires bilateral tubal sterilization.  Other reversible forms of contraception were discussed with patient; she declines all other modalities. Discussed bilateral tubal sterilization in detail; discussed options of laparoscopic bilateral tubal sterilization using Filshie clips vs laparoscopic bilateral salpingectomy. Risks and benefits discussed in detail including but not limited to: risk of regret, permanence of method, bleeding, infection, injury to surrounding organs and need for additional procedures.  Failure risk of 1-2 % for Filshie clips and <1% for bilateral salpingectomy with increased risk of ectopic gestation if pregnancy occurs was also discussed with patient.  Also discussed possible reduction of risk of ovarian cancer via bilateral salpingectomy given that a growing body of knowledge reveals that the majority of cases of high grade serous "ovarian" cancer actually are actually  cancers arising from the fimbriated end of the fallopian tubes. Emphasized that removal of fallopian tubes do not result in any known hormonal imbalance.  Patient verbalized understanding of these risks and benefits and wants to proceed with sterilization with laparoscopic bilateral sterilization using Filshie clips.    Medicaid papers have been signed , patient understands that surgery will be scheduled at least 30 days after the day papers are signed as per Medicaid  guidelines.  Live interrupter was used during visit     LOS: 2 days   Chancy Milroy 12/02/2018, 9:01 AM

## 2018-12-02 NOTE — Discharge Instructions (Signed)

## 2021-06-05 ENCOUNTER — Emergency Department (HOSPITAL_COMMUNITY)
Admission: EM | Admit: 2021-06-05 | Discharge: 2021-06-06 | Disposition: A | Discharge status: Home / Self Care | Payer: Self-pay | Attending: Emergency Medicine | Admitting: Emergency Medicine

## 2021-06-05 DIAGNOSIS — M79671 Pain in right foot: Secondary | ICD-10-CM | POA: Insufficient documentation

## 2021-06-05 DIAGNOSIS — N9489 Other specified conditions associated with female genital organs and menstrual cycle: Secondary | ICD-10-CM | POA: Insufficient documentation

## 2021-06-05 DIAGNOSIS — M545 Low back pain, unspecified: Secondary | ICD-10-CM | POA: Insufficient documentation

## 2021-06-05 DIAGNOSIS — M542 Cervicalgia: Secondary | ICD-10-CM | POA: Insufficient documentation

## 2021-06-05 DIAGNOSIS — R55 Syncope and collapse: Secondary | ICD-10-CM | POA: Insufficient documentation

## 2021-06-05 NOTE — ED Notes (Signed)
MD at bedside, pt currently communicating using translator.

## 2021-06-05 NOTE — ED Triage Notes (Addendum)
Pt bib [facility] from home after a fall. Pts husband stated they were in the kitchen when pt suddenly collapsed and hit her head and then went unresponsive. Per ems pt has been minimally responsive, non verbal, and only moving arms. Pts husband stated she had a similar episode when she was pregnant but husband states she is currently on birth control pill. No hx of seizures. VSS with ems. C collar in place on arrival.  BP 125/64, HR 64-72

## 2021-06-06 ENCOUNTER — Emergency Department (HOSPITAL_COMMUNITY): Payer: Self-pay

## 2021-06-06 ENCOUNTER — Other Ambulatory Visit: Payer: Self-pay

## 2021-06-06 LAB — CBC
HCT: 40 % (ref 36.0–46.0)
Hemoglobin: 13.6 g/dL (ref 12.0–15.0)
MCH: 29.7 pg (ref 26.0–34.0)
MCHC: 34 g/dL (ref 30.0–36.0)
MCV: 87.3 fL (ref 80.0–100.0)
Platelets: 232 10*3/uL (ref 150–400)
RBC: 4.58 MIL/uL (ref 3.87–5.11)
RDW: 12.4 % (ref 11.5–15.5)
WBC: 8.1 10*3/uL (ref 4.0–10.5)
nRBC: 0 % (ref 0.0–0.2)

## 2021-06-06 LAB — BASIC METABOLIC PANEL
Anion gap: 9 (ref 5–15)
BUN: 17 mg/dL (ref 6–20)
CO2: 23 mmol/L (ref 22–32)
Calcium: 8.9 mg/dL (ref 8.9–10.3)
Chloride: 107 mmol/L (ref 98–111)
Creatinine, Ser: 0.82 mg/dL (ref 0.44–1.00)
GFR, Estimated: >60 mL/min (ref >60)
Glucose, Bld: 134 mg/dL — ABNORMAL HIGH (ref 70–99)
Potassium: 3.9 mmol/L (ref 3.5–5.1)
Sodium: 139 mmol/L (ref 135–145)

## 2021-06-06 LAB — I-STAT BETA HCG BLOOD, ED (MC, WL, AP ONLY): I-stat hCG, quantitative: <5 m[IU]/mL (ref <5)

## 2021-06-06 LAB — CBG MONITORING, ED: Glucose-Capillary: 112 mg/dL — ABNORMAL HIGH (ref 70–99)

## 2021-06-06 MED ORDER — MELOXICAM 7.5 MG PO TABS
15.0000 mg | ORAL_TABLET | Freq: Once | ORAL | Status: AC
  Administered 2021-06-06: 15 mg via ORAL
  Filled 2021-06-06: qty 2

## 2021-06-06 MED ORDER — MELOXICAM 7.5 MG PO TABS: 7.5000 mg | ORAL_TABLET | Freq: Every day | ORAL | 0 refills | Status: DC

## 2021-06-06 NOTE — ED Notes (Signed)
Patient transported to CT 

## 2021-06-06 NOTE — ED Notes (Signed)
Pt ambulated in the hall without assistance tolerated well no complaints.

## 2021-06-06 NOTE — ED Provider Notes (Signed)
[facility] Note   CSN: [id number] Arrival date & time: 06/05/21  2353     History  Chief Complaint  Patient presents with   Fall   Loss of Consciousness    Greggory BrandyBessy L Garcia Raudales is a 26 y.o. female.  [age] year old female that presents emerged part today secondary to loss of consciousness.  Patient presented via [facility].  Leg wounds interpreter used on arrival.  It sounds like the patient was walking back from taking care of her baby when she suddenly passed out.  She does not remember anything else.  She states she hurts in her lower back, neck, right foot.  Has never passed out before.  No reported seizure-like activity.  No preceding headache, chest pain, palpitations or other associated symptoms. On further history it sounds like she was just recently treated for some type of vaginitis and has persistent suprapubic pain related to that.  She has an appointment with her doctor in the next week.   The history is provided by the EMS personnel. The history is limited by a language barrier. A language interpreter was used.  Fall  Loss of Consciousness     Home Medications Prior to Admission medications   Medication Sig Start Date End Date Taking? Authorizing Provider  meloxicam (MOBIC) 7.5 MG tablet Take 1 tablet (7.5 mg total) by mouth daily. 06/06/21  Yes Twain Stenseth, Barbara CowerJason, MD  ibuprofen (ADVIL) 600 MG tablet Take 1 tablet (600 mg total) by mouth every 6 (six) hours. 12/02/18   Arvilla MarketWallace, Catherine Lauren, MD  Prenatal Vit-Fe Fumarate-FA (PRENATAL VITAMIN PO) Take by mouth.    [provider]      Allergies    Penicillins    Review of Systems   Review of Systems  Cardiovascular:  Positive for syncope.   Physical Exam Updated Vital Signs BP (!) 127/91    Pulse 62    Temp 98.3 F (36.8 C) (Oral)    Resp 14    Ht 5' (1.524 m)    Wt 62.6 kg    SpO2 100%    BMI 26.95 kg/m  Physical Exam Vitals and nursing note  reviewed.  Constitutional:      Appearance: She is well-developed.  HENT:     Head: Normocephalic and atraumatic.  Eyes:     Pupils: Pupils are equal, round, and reactive to light.  Cardiovascular:     Rate and Rhythm: Normal rate and regular rhythm.  Pulmonary:     Effort: Pulmonary effort is normal. No respiratory distress.     Breath sounds: No stridor.  Abdominal:     General: Abdomen is flat. There is no distension.  Musculoskeletal:        General: Tenderness (Lumbar spine, right foot) present.     Cervical back: Normal range of motion.  Neurological:     General: No focal deficit present.     Mental Status: She is alert.    ED Results / Procedures / Treatments   Labs (all labs ordered are listed, but only abnormal results are displayed) Labs Reviewed  BASIC METABOLIC PANEL - Abnormal; Notable for the following components:      Result Value   Glucose, Bld 134 (*)    All other components within normal limits  CBG MONITORING, ED - Abnormal; Notable for the following components:   Glucose-Capillary 112 (*)    All other components within normal limits  CBC  URINALYSIS, ROUTINE W REFLEX MICROSCOPIC  MAGNESIUM  I-STAT BETA HCG BLOOD, ED ([facility], [facility], [facility] ONLY)    EKG EKG Interpretation  Date/Time:  Friday June 05 2021 23:55:56 EST Ventricular Rate:  75 PR Interval:  192 QRS Duration: 89 QT Interval:  361 QTC Calculation: 404 R Axis:   29 Text Interpretation: Sinus rhythm Low voltage, precordial leads Confirmed by Marily Memos [phone]) on 06/06/2021 2:21:28 AM  Radiology DG Thoracic Spine 2 View  Result Date: 06/06/2021 CLINICAL DATA:  Recent fall with back pain, initial encounter EXAM: THORACIC SPINE 2 VIEWS COMPARISON:  None. FINDINGS: There is no evidence of thoracic spine fracture. Alignment is normal. No other significant bone abnormalities are identified. IMPRESSION: No acute abnormality noted. Electronically Signed   By: Alcide Clever M.D.   On: 06/06/2021 01:27    DG Lumbar Spine 2-3 Views  Result Date: 06/06/2021 CLINICAL DATA:  Fall EXAM: LUMBAR SPINE - 2-3 VIEW COMPARISON:  None. FINDINGS: Five lumbar-type vertebral bodies. Normal lumbar lordosis. No evidence of fracture or dislocation. Visualized bony pelvis appears intact. IMPRESSION: Negative. Electronically Signed   By: Charline Bills M.D.   On: 06/06/2021 01:28   DG Pelvis 1-2 Views  Result Date: 06/06/2021 CLINICAL DATA:  Fall EXAM: PELVIS - 1-2 VIEW COMPARISON:  None. FINDINGS: No fracture or dislocation is seen. The joint spaces are preserved. Visualized bony pelvis appears intact. IMPRESSION: Negative. Electronically Signed   By: Charline Bills M.D.   On: 06/06/2021 01:27   CT Head Wo Contrast  Result Date: 06/06/2021 CLINICAL DATA:  Syncope, head injury, unresponsive EXAM: CT HEAD WITHOUT CONTRAST CT CERVICAL SPINE WITHOUT CONTRAST TECHNIQUE: Multidetector CT imaging of the head and cervical spine was performed following the standard protocol without intravenous contrast. Multiplanar CT image reconstructions of the cervical spine were also generated. RADIATION DOSE REDUCTION: This exam was performed according to the departmental dose-optimization program which includes automated exposure control, adjustment of the mA and/or kV according to patient size and/or use of iterative reconstruction technique. COMPARISON:  None. FINDINGS: CT HEAD FINDINGS Brain: No evidence of acute infarction, hemorrhage, hydrocephalus, extra-axial collection or mass lesion/mass effect. Vascular: No hyperdense vessel or unexpected calcification. Skull: Normal. Negative for fracture or focal lesion. Sinuses/Orbits: The visualized paranasal sinuses are essentially clear. The mastoid air cells are unopacified. Other: None. CT CERVICAL SPINE FINDINGS Alignment: Normal cervical lordosis. Skull base and vertebrae: No acute fracture. No primary bone lesion or focal pathologic process. Soft tissues and spinal canal: No  prevertebral fluid or swelling. No visible canal hematoma. Disc levels: Intervertebral disc spaces are maintained. Spinal canal is patent. Upper chest: Visualized lung apices are clear. Other: Visualized thyroid is unremarkable. IMPRESSION: Normal head CT. Normal cervical spine CT. Electronically Signed   By: Charline Bills M.D.   On: 06/06/2021 00:52   CT Cervical Spine Wo Contrast  Result Date: 06/06/2021 CLINICAL DATA:  Syncope, head injury, unresponsive EXAM: CT HEAD WITHOUT CONTRAST CT CERVICAL SPINE WITHOUT CONTRAST TECHNIQUE: Multidetector CT imaging of the head and cervical spine was performed following the standard protocol without intravenous contrast. Multiplanar CT image reconstructions of the cervical spine were also generated. RADIATION DOSE REDUCTION: This exam was performed according to the departmental dose-optimization program which includes automated exposure control, adjustment of the mA and/or kV according to patient size and/or use of iterative reconstruction technique. COMPARISON:  None. FINDINGS: CT HEAD FINDINGS Brain: No evidence of acute infarction, hemorrhage, hydrocephalus, extra-axial collection or mass lesion/mass effect. Vascular: No hyperdense vessel or unexpected calcification. Skull: Normal. Negative for fracture or  focal lesion. Sinuses/Orbits: The visualized paranasal sinuses are essentially clear. The mastoid air cells are unopacified. Other: None. CT CERVICAL SPINE FINDINGS Alignment: Normal cervical lordosis. Skull base and vertebrae: No acute fracture. No primary bone lesion or focal pathologic process. Soft tissues and spinal canal: No prevertebral fluid or swelling. No visible canal hematoma. Disc levels: Intervertebral disc spaces are maintained. Spinal canal is patent. Upper chest: Visualized lung apices are clear. Other: Visualized thyroid is unremarkable. IMPRESSION: Normal head CT. Normal cervical spine CT. Electronically Signed   By: Charline Bills M.D.    On: 06/06/2021 00:52   DG Foot Complete Right  Result Date: 06/06/2021 CLINICAL DATA:  Fall EXAM: RIGHT FOOT COMPLETE - 3+ VIEW COMPARISON:  None. FINDINGS: No fracture or dislocation is seen. The joint spaces are preserved. The visualized soft tissues are unremarkable. IMPRESSION: Negative. Electronically Signed   By: Charline Bills M.D.   On: 06/06/2021 01:28    Procedures Procedures    Medications Ordered in ED Medications  meloxicam (MOBIC) tablet 15 mg (15 mg Oral Given 06/06/21 5643)    ED Course/ Medical Decision Making/ A&P                           Medical Decision Making Amount and/or Complexity of Data Reviewed Labs: ordered. Radiology: ordered.  Risk Prescription drug management.   Reviewed her x-rays and CT scans without any obvious injuries.  Radiology reads are reviewed as well.  Patient is a new mother and no other comorbidities or social determinants of health.  No obvious traumatic injuries.  No obvious reason for her to syncopized.  Could been vasovagal or multiple other reasons. Able to ambulate in hall. Offer food as well. Stable for dc with NSAIDs.   Final Clinical Impression(s) / ED Diagnoses Final diagnoses:  Syncope and collapse    Rx / DC Orders ED Discharge Orders          Ordered    meloxicam (MOBIC) 7.5 MG tablet  Daily        06/06/21 0318              Javeah Loeza, Barbara Cower, MD 06/06/21 [phone]

## 2022-01-19 ENCOUNTER — Ambulatory Visit: Payer: Self-pay | Admitting: Internal Medicine

## 2022-01-27 ENCOUNTER — Ambulatory Visit: Payer: BC Managed Care – PPO | Attending: Internal Medicine | Admitting: Critical Care Medicine

## 2022-01-27 ENCOUNTER — Other Ambulatory Visit (HOSPITAL_COMMUNITY)
Admission: RE | Admit: 2022-01-27 | Discharge: 2022-01-27 | Disposition: A | Discharge status: Home / Self Care | Payer: BC Managed Care – PPO | Source: Ambulatory Visit | Attending: Critical Care Medicine | Admitting: Critical Care Medicine

## 2022-01-27 ENCOUNTER — Encounter: Payer: Self-pay | Admitting: Critical Care Medicine

## 2022-01-27 VITALS — BP 112/77 | HR 63 | Ht 61.0 in | Wt 138.4 lb

## 2022-01-27 DIAGNOSIS — D649 Anemia, unspecified: Secondary | ICD-10-CM

## 2022-01-27 DIAGNOSIS — N898 Other specified noninflammatory disorders of vagina: Secondary | ICD-10-CM

## 2022-01-27 DIAGNOSIS — Z23 Encounter for immunization: Secondary | ICD-10-CM

## 2022-01-27 DIAGNOSIS — N760 Acute vaginitis: Secondary | ICD-10-CM | POA: Diagnosis not present

## 2022-01-27 DIAGNOSIS — R350 Frequency of micturition: Secondary | ICD-10-CM | POA: Insufficient documentation

## 2022-01-27 DIAGNOSIS — J309 Allergic rhinitis, unspecified: Secondary | ICD-10-CM

## 2022-01-27 DIAGNOSIS — N926 Irregular menstruation, unspecified: Secondary | ICD-10-CM

## 2022-01-27 DIAGNOSIS — Z114 Encounter for screening for human immunodeficiency virus [HIV]: Secondary | ICD-10-CM

## 2022-01-27 DIAGNOSIS — R739 Hyperglycemia, unspecified: Secondary | ICD-10-CM

## 2022-01-27 DIAGNOSIS — Z139 Encounter for screening, unspecified: Secondary | ICD-10-CM

## 2022-01-27 DIAGNOSIS — Z1159 Encounter for screening for other viral diseases: Secondary | ICD-10-CM

## 2022-01-27 DIAGNOSIS — R0982 Postnasal drip: Secondary | ICD-10-CM

## 2022-01-27 LAB — POCT URINALYSIS DIP (CLINITEK)
Bilirubin, UA: NEGATIVE
Blood, UA: NEGATIVE
Glucose, UA: NEGATIVE mg/dL
Ketones, POC UA: NEGATIVE mg/dL
Leukocytes, UA: NEGATIVE
Nitrite, UA: NEGATIVE
POC PROTEIN,UA: NEGATIVE
Spec Grav, UA: 1.015 (ref 1.010–1.025)
Urobilinogen, UA: 0.2 E.U./dL
pH, UA: 7 (ref 5.0–8.0)

## 2022-01-27 MED ORDER — AZELASTINE HCL 0.1 % NA SOLN: 2.0000 | Freq: Two times a day (BID) | NASAL | 12 refills | Status: AC

## 2022-01-27 NOTE — Patient Instructions (Signed)
Labs today include cervical vaginal swab and urine study along with health screening labs  Begin Astelin 2 sprays each nostril twice daily this was sent to your [facility] pharmacy  For headaches you can take 400 mg of ibuprofen 4 times daily as needed or extra strength Tylenol to 3 times daily as needed for headaches  Stay on Depo-Provera injections every 3 months but you also reviewed the other options on the information we shared with you in the clinic  For your sexual drive conditions I have ordered an estrogen level which is a blood test  You declined the flu vaccine but agreed to receive the first of a 2 vaccine series HPV human papilloma virus vaccine, your next vaccine will be in 6 weeks we will bring him in for nurse visit  Return to see Dr. Joya Gaskins 5 months  Los laboratorios de hoy incluyen hisopos vaginales cervicales y estudios de [place] junto con laboratorios de Programme researcher, broadcasting/film/video de Passenger transport manager.  Comience con Astelin 2 pulverizaciones en cada fosa nasal dos veces al da. Esto se envi a Artist.  Para los dolores de [place], [place] tomar 400 mg de ibuprofeno 4 veces al da segn sea necesario o Tylenol extra fuerte hasta 3 veces al da segn sea necesario para los dolores de [place].  Contine con las inyecciones de Depo-Provera cada 3 meses, pero tambin revis las otras opciones en la informacin que compartimos con usted en la clnica.  Para tus condiciones de deseo sexual he ordenado un nivel de estrgeno que es un anlisis de [place].  Usted rechaz la vacuna contra la gripe, pero acept recibir la primera de una serie de dos vacunas, la vacuna contra el virus del papiloma humano contra el VPH. Su prxima vacuna ser en 6 semanas. Lo llevaremos a una visita de enfermera.  Volver a ver al Dr. Joya Gaskins 5 meses

## 2022-01-27 NOTE — Progress Notes (Signed)
New Patient Office Visit  Subjective    Patient ID: Stephanie Quinn, female    DOB: [birth year]  Age: 26 y.o. MRN: [id number]  CC:  Chief Complaint  Patient presents with   New Patient (Initial Visit)    HPI Stephanie Quinn presents to establish care and this visit was assisted by video interpreter [phone] This is a pleasant 26 year old female who has lived in [place] for 5 years prior to this lived in [place].  She has a history of vaginal discharge with foul smelling odor for 3 weeks right-sided headaches and decreased sexual drive.  She is on Depo-Provera 1 injection every 3 months Patient notes increased urinary frequency with some degree of polyuria and note blood sugar in January of this year was 130.  She notes she has numbness in the hands at times she has had some mild hair loss and pain behind the right eye with headaches. Reviewed his questionnaire below.  Patient does not wish to become pregnant the next 12 months previously had a Nexplanon implant did not like side effects now is getting the Depo-Provera at another clinic 1 injection every 3 months.  Last injection was July.  Patient is due a Pap smear.  Patient declines a flu vaccine.  Patient does agree to start an HPV vaccine series.  Patient does note postnasal drainage and sinus fullness.  She denies any ear symptoms.  She has occasional blurring of vision.  She notes a decreased sexual drive and moodiness.  She is not suicidal.  She has 2 children ages 2 and 4.  She is not employed outside the home.  She does work out with exercise lifting weights.   [facility] - 01/27/22 0912       Pregnancy Intention Screening   Does the patient want to become pregnant in the next year? No    Does the patient's partner want to become pregnant in the next year? No    Would the patient like to discuss contraceptive options today? N/A      Contraception Wrap Up   Current Method Hormonal Injection    End Method  Hormonal Injection    Contraception Counseling Provided Yes    How was the end contraceptive method provided? Provided on site            The pregnancy intention screening data noted above was reviewed. Potential methods of contraception were discussed. The patient elected to proceed with Hormonal Injection.  Outpatient Encounter Medications as of 01/27/2022  Medication Sig   azelastine (ASTELIN) 0.1 % nasal spray Place 2 sprays into both nostrils 2 (two) times daily. Use in each nostril as directed   [DISCONTINUED] betamethasone dipropionate 0.05 % cream SMARTSIG:sparingly Topical Daily   [DISCONTINUED] ibuprofen (ADVIL) 600 MG tablet Take 1 tablet (600 mg total) by mouth every 6 (six) hours.   [DISCONTINUED] meloxicam (MOBIC) 7.5 MG tablet Take 1 tablet (7.5 mg total) by mouth daily.   [DISCONTINUED] omeprazole (PRILOSEC) 20 MG capsule Take 20 mg by mouth daily.   [DISCONTINUED] Prenatal Vit-Fe Fumarate-FA (PRENATAL VITAMIN PO) Take by mouth.   [DISCONTINUED] triamcinolone cream (KENALOG) 0.1 % SMARTSIG:sparingly Topical Twice Daily   No facility-administered encounter medications on file as of 01/27/2022.    Past Medical History:  Diagnosis Date   Medical history non-contributory     Past Surgical History:  Procedure Laterality Date   NO PAST SURGERIES     WISDOM TOOTH EXTRACTION  Family History  Problem Relation Age of Onset   Ovarian cysts Mother     Social History   Socioeconomic History   Marital status: Single    Spouse name: Not on file   Number of children: 2   Years of education: Not on file   Highest education level: Not on file  Occupational History   Not on file  Tobacco Use   Smoking status: Never   Smokeless tobacco: Never  Vaping Use   Vaping Use: Never used  Substance and Sexual Activity   Alcohol use: No   Drug use: No   Sexual activity: Never  Other Topics Concern   Not on file  Social History Narrative   Two children age 9 and 69 yo    Social Determinants of Corporate investment banker Strain: Not on file  Food Insecurity: Not on file  Transportation Needs: Not on file  Physical Activity: Not on file  Stress: Not on file  Social Connections: Not on file  Intimate Partner Violence: Not on file    Review of Systems  Constitutional:  Positive for malaise/fatigue. Negative for chills, diaphoresis, fever and weight loss.  HENT:  Negative for congestion, ear discharge, ear pain, hearing loss, nosebleeds, sinus pain, sore throat and tinnitus.   Eyes:  Positive for blurred vision. Negative for photophobia, pain, discharge and redness.  Respiratory:  Negative for cough, hemoptysis, sputum production, shortness of breath, wheezing and stridor.   Cardiovascular:  Positive for chest pain. Negative for palpitations, orthopnea, claudication, leg swelling and PND.  Gastrointestinal:  Negative for abdominal pain, blood in stool, constipation, diarrhea, heartburn, melena, nausea and vomiting.  Genitourinary:  Positive for frequency. Negative for dysuria, flank pain, hematuria and urgency.       Polyuria Incontinent Vag odor and discharge  Musculoskeletal:  Negative for back pain, falls, joint pain, myalgias and neck pain.  Skin:  Negative for itching and rash.  Neurological:  Positive for headaches. Negative for dizziness, tingling, tremors, sensory change, speech change, focal weakness, seizures, loss of consciousness and weakness.  Endo/Heme/Allergies:  Negative for environmental allergies and polydipsia. Does not bruise/bleed easily.  Psychiatric/Behavioral:  Negative for depression, hallucinations, memory loss, substance abuse and suicidal ideas. The patient is not nervous/anxious and does not have insomnia.         Objective    BP 112/77   Pulse 63   Ht 5\' 1"  (1.549 m)   Wt 138 lb 6.4 oz (62.8 kg)   SpO2 98%   BMI 26.15 kg/m   Physical Exam Vitals reviewed.  Constitutional:      Appearance: Normal appearance.  She is well-developed. She is not diaphoretic.  HENT:     Head: Normocephalic and atraumatic.     Right Ear: Tympanic membrane, ear canal and external ear normal. There is no impacted cerumen.     Left Ear: Tympanic membrane, ear canal and external ear normal. There is no impacted cerumen.     Nose: Rhinorrhea present. No nasal deformity, septal deviation or mucosal edema.     Right Sinus: No maxillary sinus tenderness or frontal sinus tenderness.     Left Sinus: No maxillary sinus tenderness or frontal sinus tenderness.     Mouth/Throat:     Mouth: Mucous membranes are moist.     Pharynx: Oropharynx is clear. No oropharyngeal exudate or posterior oropharyngeal erythema.  Eyes:     General: No scleral icterus.    Conjunctiva/sclera: Conjunctivae normal.     Pupils:  Pupils are equal, round, and reactive to light.  Neck:     Thyroid: No thyromegaly.     Vascular: No carotid bruit or JVD.     Trachea: Trachea normal. No tracheal tenderness or tracheal deviation.  Cardiovascular:     Rate and Rhythm: Normal rate and regular rhythm.     Chest Wall: PMI is not displaced.     Pulses: Normal pulses. No decreased pulses.     Heart sounds: Normal heart sounds, S1 normal and S2 normal. Heart sounds not distant. No murmur heard.    No systolic murmur is present.     No diastolic murmur is present.     No friction rub. No gallop. No S3 or S4 sounds.  Pulmonary:     Effort: No tachypnea, accessory muscle usage or respiratory distress.     Breath sounds: No stridor. No decreased breath sounds, wheezing, rhonchi or rales.     Comments: Muscular skeletal tenderness involving the upper chest area and shoulder area patient states this came on when she started increasing weights in the gym Chest:     Chest wall: Tenderness present.  Abdominal:     General: Bowel sounds are normal. There is no distension.     Palpations: Abdomen is soft. Abdomen is not rigid.     Tenderness: There is no abdominal  tenderness. There is no guarding or rebound.  Musculoskeletal:        General: Normal range of motion.     Cervical back: Normal range of motion and neck supple. No edema, erythema or rigidity. No muscular tenderness. Normal range of motion.  Lymphadenopathy:     Head:     Right side of head: No submental or submandibular adenopathy.     Left side of head: No submental or submandibular adenopathy.     Cervical: No cervical adenopathy.  Skin:    General: Skin is warm and dry.     Coloration: Skin is not pale.     Findings: No rash.     Nails: There is no clubbing.  Neurological:     General: No focal deficit present.     Mental Status: She is alert and oriented to person, place, and time.     Cranial Nerves: No cranial nerve deficit.     Sensory: No sensory deficit.     Motor: No weakness.     Coordination: Coordination normal.     Gait: Gait normal.     Deep Tendon Reflexes: Reflexes normal.  Psychiatric:        Mood and Affect: Mood normal.        Speech: Speech normal.        Behavior: Behavior normal.        Thought Content: Thought content normal.        Judgment: Judgment normal.         Assessment & Plan:   Problem List Items Addressed This Visit       Respiratory   Allergic rhinitis with postnasal drip     Other   Encounter for health-related screening   Relevant Orders   Comprehensive metabolic panel   CBC with Differential/Platelet   Lipid panel   Hyperglycemia   Relevant Orders   Hemoglobin A1c   Comprehensive metabolic panel   Vaginal discharge - Primary   Relevant Orders   Cervicovaginal ancillary only   POCT URINALYSIS DIP (CLINITEK)   Urinary frequency   Relevant Orders   POCT URINALYSIS DIP (CLINITEK)  Other Visit Diagnoses     Need for hepatitis C screening test       Relevant Orders   HCV Ab w Reflex to Quant PCR   Encounter for screening for HIV       Relevant Orders   HIV antibody (with reflex)     HPV vaccine given will come  back for second 2 vaccine series  Return in about 5 months (around 06/29/2022) for chronic conditions.   Shan LevansPatrick Davide Risdon, MD

## 2022-01-27 NOTE — Assessment & Plan Note (Signed)
Complete health screening labs will be given

## 2022-01-27 NOTE — Assessment & Plan Note (Signed)
Vaginal discharge associated with vaginal irregularity some alopecia and decreased sexual drive and fatigue  Plan to check estrogen levels General health screening labs Cervical vaginal swab  Note urinalysis normal urine is not infected doubt cystitis

## 2022-01-27 NOTE — Assessment & Plan Note (Signed)
Prior history of anemia with pregnancy we will recheck CBC

## 2022-01-27 NOTE — Assessment & Plan Note (Signed)
Allergic rhinitis with postnasal drainage we will begin Astelin nasal spray 2 sprays each nostril twice daily likely headaches are being contributed by the symptom complex

## 2022-01-27 NOTE — Assessment & Plan Note (Signed)
History of hyperglycemia suspect possibly early type 2 diabetes or prediabetes we will screen for such

## 2022-01-28 ENCOUNTER — Other Ambulatory Visit: Payer: Self-pay | Admitting: Critical Care Medicine

## 2022-01-28 DIAGNOSIS — N760 Acute vaginitis: Secondary | ICD-10-CM | POA: Insufficient documentation

## 2022-01-28 LAB — COMPREHENSIVE METABOLIC PANEL
ALT: 17 IU/L (ref 0–32)
AST: 28 IU/L (ref 0–40)
Albumin/Globulin Ratio: 1.9 (ref 1.2–2.2)
Albumin: 5.1 g/dL — ABNORMAL HIGH (ref 4.0–5.0)
Alkaline Phosphatase: 82 IU/L (ref 44–121)
BUN/Creatinine Ratio: 22 (ref 9–23)
BUN: 17 mg/dL (ref 6–20)
Bilirubin Total: 0.4 mg/dL (ref 0.0–1.2)
CO2: 21 mmol/L (ref 20–29)
Calcium: 9.7 mg/dL (ref 8.7–10.2)
Chloride: 102 mmol/L (ref 96–106)
Creatinine, Ser: 0.78 mg/dL (ref 0.57–1.00)
Globulin, Total: 2.7 g/dL (ref 1.5–4.5)
Glucose: 93 mg/dL (ref 70–99)
Potassium: 4.3 mmol/L (ref 3.5–5.2)
Sodium: 139 mmol/L (ref 134–144)
Total Protein: 7.8 g/dL (ref 6.0–8.5)
eGFR: 109 mL/min/{1.73_m2} (ref >59)

## 2022-01-28 LAB — HEMOGLOBIN A1C
Est. average glucose Bld gHb Est-mCnc: 105 mg/dL
Hgb A1c MFr Bld: 5.3 % (ref 4.8–5.6)

## 2022-01-28 LAB — CBC WITH DIFFERENTIAL/PLATELET
Basophils Absolute: 0.1 10*3/uL (ref 0.0–0.2)
Basos: 1 %
EOS (ABSOLUTE): 0.1 10*3/uL (ref 0.0–0.4)
Eos: 1 %
Hematocrit: 42.4 % (ref 34.0–46.6)
Hemoglobin: 14.2 g/dL (ref 11.1–15.9)
Immature Grans (Abs): 0 10*3/uL (ref 0.0–0.1)
Immature Granulocytes: 0 %
Lymphocytes Absolute: 3 10*3/uL (ref 0.7–3.1)
Lymphs: 37 %
MCH: 29.3 pg (ref 26.6–33.0)
MCHC: 33.5 g/dL (ref 31.5–35.7)
MCV: 88 fL (ref 79–97)
Monocytes Absolute: 0.5 10*3/uL (ref 0.1–0.9)
Monocytes: 6 %
Neutrophils Absolute: 4.4 10*3/uL (ref 1.4–7.0)
Neutrophils: 55 %
Platelets: 252 10*3/uL (ref 150–450)
RBC: 4.84 x10E6/uL (ref 3.77–5.28)
RDW: 12.1 % (ref 11.7–15.4)
WBC: 8.1 10*3/uL (ref 3.4–10.8)

## 2022-01-28 LAB — CERVICOVAGINAL ANCILLARY ONLY
Bacterial Vaginitis (gardnerella): POSITIVE — AB
Candida Glabrata: NEGATIVE
Candida Vaginitis: NEGATIVE
Chlamydia: NEGATIVE
Comment: NEGATIVE
Comment: NEGATIVE
Comment: NEGATIVE
Comment: NEGATIVE
Comment: NEGATIVE
Comment: NORMAL
Neisseria Gonorrhea: NEGATIVE
Trichomonas: NEGATIVE

## 2022-01-28 LAB — HCV INTERPRETATION

## 2022-01-28 LAB — LIPID PANEL
Chol/HDL Ratio: 2.7 ratio (ref 0.0–4.4)
Cholesterol, Total: 112 mg/dL (ref 100–199)
HDL: 41 mg/dL (ref >39)
LDL Chol Calc (NIH): 55 mg/dL (ref 0–99)
Triglycerides: 79 mg/dL (ref 0–149)
VLDL Cholesterol Cal: 16 mg/dL (ref 5–40)

## 2022-01-28 LAB — HCV AB W REFLEX TO QUANT PCR: HCV Ab: NONREACTIVE

## 2022-01-28 LAB — HIV ANTIBODY (ROUTINE TESTING W REFLEX): HIV Screen 4th Generation wRfx: NONREACTIVE

## 2022-01-28 MED ORDER — METRONIDAZOLE 500 MG PO TABS: 500.0000 mg | ORAL_TABLET | Freq: Two times a day (BID) | ORAL | 0 refills | Status: AC

## 2022-01-28 NOTE — Progress Notes (Signed)
Let pt know liver, kidney normal, hep C neg, no diabetes, blood count normal, cholesterol normal, HIV neg,   she has bacteria in vagina causing foul odor  RX for flagyl sent to pharmacy( her [facility])

## 2022-01-29 ENCOUNTER — Telehealth: Payer: Self-pay

## 2022-01-29 NOTE — Telephone Encounter (Signed)
Pt was called and is aware of results, DOB was confirmed.   Interpreter [phone]

## 2022-01-29 NOTE — Telephone Encounter (Signed)
-----   Message from Elsie Stain, MD sent at 01/28/2022  5:51 AM EDT ----- Let pt know liver, kidney normal, hep C neg, no diabetes, blood count normal, cholesterol normal, HIV neg,   she has bacteria in vagina causing foul odor  RX for flagyl sent to pharmacy( her [facility])

## 2022-03-11 ENCOUNTER — Ambulatory Visit: Payer: Self-pay | Admitting: Physician Assistant

## 2022-06-28 NOTE — Progress Notes (Deleted)
New Patient Office Visit  Subjective    Patient ID: Stephanie Quinn, female    DOB: [birth year]  Age: 27 y.o. MRN: [id number]  CC:  No chief complaint on file.   HPI Stephanie Quinn presents to establish care and this visit was assisted by video interpreter [phone] This is a pleasant 27 year old female who has lived in [place] for 5 years prior to this lived in [place].  She has a history of vaginal discharge with foul smelling odor for 3 weeks right-sided headaches and decreased sexual drive.  She is on Depo-Provera 1 injection every 3 months Patient notes increased urinary frequency with some degree of polyuria and note blood sugar in January of this year was 130.  She notes she has numbness in the hands at times she has had some mild hair loss and pain behind the right eye with headaches. Reviewed his questionnaire below.  Patient does not wish to become pregnant the next 12 months previously had a Nexplanon implant did not like side effects now is getting the Depo-Provera at another clinic 1 injection every 3 months.  Last injection was July.  Patient is due a Pap smear.  Patient declines a flu vaccine.  Patient does agree to start an HPV vaccine series.  Patient does note postnasal drainage and sinus fullness.  She denies any ear symptoms.  She has occasional blurring of vision.  She notes a decreased sexual drive and moodiness.  She is not suicidal.  She has 2 children ages 75 and 4.  She is not employed outside the home.  She does work out with exercise lifting weights.    The pregnancy intention screening data noted above was reviewed. Potential methods of contraception were discussed. The patient elected to proceed with No data recorded.  Outpatient Encounter Medications as of 06/29/2022  Medication Sig   azelastine (ASTELIN) 0.1 % nasal spray Place 2 sprays into both nostrils 2 (two) times daily. Use in each nostril as directed   No facility-administered  encounter medications on file as of 06/29/2022.    Past Medical History:  Diagnosis Date   Medical history non-contributory     Past Surgical History:  Procedure Laterality Date   NO PAST SURGERIES     WISDOM TOOTH EXTRACTION      Family History  Problem Relation Age of Onset   Ovarian cysts Mother     Social History   Socioeconomic History   Marital status: Single    Spouse name: Not on file   Number of children: 2   Years of education: Not on file   Highest education level: Not on file  Occupational History   Not on file  Tobacco Use   Smoking status: Never   Smokeless tobacco: Never  Vaping Use   Vaping Use: Never used  Substance and Sexual Activity   Alcohol use: No   Drug use: No   Sexual activity: Never  Other Topics Concern   Not on file  Social History Narrative   Two children age 17 and 13 yo   Social Determinants of Radio broadcast assistant Strain: Not on file  Food Insecurity: Not on file  Transportation Needs: Not on file  Physical Activity: Not on file  Stress: Not on file  Social Connections: Not on file  Intimate Partner Violence: Not on file    Review of Systems  Constitutional:  Positive for malaise/fatigue. Negative for chills, diaphoresis, fever and weight loss.  HENT:  Negative for congestion, ear discharge, ear pain, hearing loss, nosebleeds, sinus pain, sore throat and tinnitus.   Eyes:  Positive for blurred vision. Negative for photophobia, pain, discharge and redness.  Respiratory:  Negative for cough, hemoptysis, sputum production, shortness of breath, wheezing and stridor.   Cardiovascular:  Positive for chest pain. Negative for palpitations, orthopnea, claudication, leg swelling and PND.  Gastrointestinal:  Negative for abdominal pain, blood in stool, constipation, diarrhea, heartburn, melena, nausea and vomiting.  Genitourinary:  Positive for frequency. Negative for dysuria, flank pain, hematuria and urgency.        Polyuria Incontinent Vag odor and discharge  Musculoskeletal:  Negative for back pain, falls, joint pain, myalgias and neck pain.  Skin:  Negative for itching and rash.  Neurological:  Positive for headaches. Negative for dizziness, tingling, tremors, sensory change, speech change, focal weakness, seizures, loss of consciousness and weakness.  Endo/Heme/Allergies:  Negative for environmental allergies and polydipsia. Does not bruise/bleed easily.  Psychiatric/Behavioral:  Negative for depression, hallucinations, memory loss, substance abuse and suicidal ideas. The patient is not nervous/anxious and does not have insomnia.         Objective    There were no vitals taken for this visit.  Physical Exam Vitals reviewed.  Constitutional:      Appearance: Normal appearance. She is well-developed. She is not diaphoretic.  HENT:     Head: Normocephalic and atraumatic.     Right Ear: Tympanic membrane, ear canal and external ear normal. There is no impacted cerumen.     Left Ear: Tympanic membrane, ear canal and external ear normal. There is no impacted cerumen.     Nose: Rhinorrhea present. No nasal deformity, septal deviation or mucosal edema.     Right Sinus: No maxillary sinus tenderness or frontal sinus tenderness.     Left Sinus: No maxillary sinus tenderness or frontal sinus tenderness.     Mouth/Throat:     Mouth: Mucous membranes are moist.     Pharynx: Oropharynx is clear. No oropharyngeal exudate or posterior oropharyngeal erythema.  Eyes:     General: No scleral icterus.    Conjunctiva/sclera: Conjunctivae normal.     Pupils: Pupils are equal, round, and reactive to light.  Neck:     Thyroid: No thyromegaly.     Vascular: No carotid bruit or JVD.     Trachea: Trachea normal. No tracheal tenderness or tracheal deviation.  Cardiovascular:     Rate and Rhythm: Normal rate and regular rhythm.     Chest Wall: PMI is not displaced.     Pulses: Normal pulses. No decreased  pulses.     Heart sounds: Normal heart sounds, S1 normal and S2 normal. Heart sounds not distant. No murmur heard.    No systolic murmur is present.     No diastolic murmur is present.     No friction rub. No gallop. No S3 or S4 sounds.  Pulmonary:     Effort: No tachypnea, accessory muscle usage or respiratory distress.     Breath sounds: No stridor. No decreased breath sounds, wheezing, rhonchi or rales.     Comments: Muscular skeletal tenderness involving the upper chest area and shoulder area patient states this came on when she started increasing weights in the gym Chest:     Chest wall: Tenderness present.  Abdominal:     General: Bowel sounds are normal. There is no distension.     Palpations: Abdomen is soft. Abdomen is not rigid.  Tenderness: There is no abdominal tenderness. There is no guarding or rebound.  Musculoskeletal:        General: Normal range of motion.     Cervical back: Normal range of motion and neck supple. No edema, erythema or rigidity. No muscular tenderness. Normal range of motion.  Lymphadenopathy:     Head:     Right side of head: No submental or submandibular adenopathy.     Left side of head: No submental or submandibular adenopathy.     Cervical: No cervical adenopathy.  Skin:    General: Skin is warm and dry.     Coloration: Skin is not pale.     Findings: No rash.     Nails: There is no clubbing.  Neurological:     General: No focal deficit present.     Mental Status: She is alert and oriented to person, place, and time.     Cranial Nerves: No cranial nerve deficit.     Sensory: No sensory deficit.     Motor: No weakness.     Coordination: Coordination normal.     Gait: Gait normal.     Deep Tendon Reflexes: Reflexes normal.  Psychiatric:        Mood and Affect: Mood normal.        Speech: Speech normal.        Behavior: Behavior normal.        Thought Content: Thought content normal.        Judgment: Judgment normal.          Assessment & Plan:   Problem List Items Addressed This Visit   None HPV vaccine given will come back for second 2 vaccine series  No follow-ups on file.   Asencion Noble, MD

## 2022-06-29 ENCOUNTER — Ambulatory Visit: Payer: Self-pay | Admitting: Critical Care Medicine

## 2022-09-21 ENCOUNTER — Ambulatory Visit: Payer: Self-pay | Admitting: *Deleted

## 2022-09-21 NOTE — Telephone Encounter (Signed)
  Chief Complaint: discharge with odor, postcoital spotting Symptoms: discharge- odor, spotting after intercourse, fatigue- low energy Frequency: 4 months Pertinent Negatives: Patient denies passed tissue, fever, menstrual-type cramps  Disposition: [] ED /[] Urgent Care (no appt availability in office) / [x] Appointment(In office/virtual)/ []  [facility]/ [] Home Care/ [] Refused Recommended Disposition /[] [facility]/ []  Follow-up with PCP Additional Notes: Appointment scheduled- patient advised contact office for changes, mobile unit information provided.

## 2022-09-21 NOTE — Telephone Encounter (Signed)
[phone] Reason for Disposition  Bleeding or spotting occurs after sex (Exception: First intercourse.)  Answer Assessment - Initial Assessment Questions 1. AMOUNT: "Describe the bleeding that you are having."    - SPOTTING: spotting, or pinkish / brownish mucous discharge; does not fill panty liner or pad    - MILD:  less than 1 pad / hour; less than patient's usual menstrual bleeding   - MODERATE: 1-2 pads / hour; 1 menstrual cup every 6 hours; small-medium blood clots (e.g., pea, grape, small coin)   - SEVERE: soaking 2 or more pads/hour for 2 or more hours; 1 menstrual cup every 2 hours; bleeding not contained by pads or continuous red blood from vagina; large blood clots (e.g., golf ball, large coin)      Bleeding after intercourse, fatigue 2. ONSET: "When did the bleeding begin?" "Is it continuing now?"     4 months- did have pap- she had [place]- small tumor- benign 3. MENSTRUAL PERIOD: "When was the last normal menstrual period?" "How is this different than your period?"     No- patient uses injection for birth control 4. REGULARITY: "How regular are your periods?"     na 5. ABDOMEN PAIN: "Do you have any pain?" "How bad is the pain?"  (e.g., Scale 1-10; mild, moderate, or severe)   - MILD (1-3): doesn't interfere with normal activities, abdomen soft and not tender to touch    - MODERATE (4-7): interferes with normal activities or awakens from sleep, abdomen tender to touch    - SEVERE (8-10): excruciating pain, doubled over, unable to do any normal activities      No- yesterday she had headache and leg pain, low energy 6. PREGNANCY: "Is there any chance you are pregnant?" "When was your last menstrual period?"     Uses Depo provera 7. BREASTFEEDING: "Are you breastfeeding?"     no 8. HORMONE MEDICINES: "Are you taking any hormone medicines, prescription or over-the-counter?" (e.g., birth control pills, estrogen)     no 9. BLOOD THINNER MEDICINES: "Do you take any blood thinners?"  (e.g., Coumadin / warfarin, Pradaxa / dabigatran, aspirin)     no 10. CAUSE: "What do you think is causing the bleeding?" (e.g., recent gyn surgery, recent gyn procedure; known bleeding disorder, cervical cancer, polycystic ovarian disease, fibroids)         Not sure- odor with discharge 11. HEMODYNAMIC STATUS: "Are you weak or feeling lightheaded?" If Yes, ask: "Can you stand and walk normally?"        Weakness- spotting- lasting once after intercourse- possibly the next day 12. OTHER SYMPTOMS: "What other symptoms are you having with the bleeding?" (e.g., passed tissue, vaginal discharge, fever, menstrual-type cramps)       discharge  Protocols used: Vaginal Bleeding - Abnormal-A-AH

## 2022-10-07 ENCOUNTER — Other Ambulatory Visit: Payer: Self-pay

## 2022-10-07 ENCOUNTER — Other Ambulatory Visit (HOSPITAL_COMMUNITY)
Admission: RE | Admit: 2022-10-07 | Discharge: 2022-10-07 | Disposition: A | Discharge status: Home / Self Care | Payer: Self-pay | Source: Ambulatory Visit | Attending: Physician Assistant | Admitting: Physician Assistant

## 2022-10-07 ENCOUNTER — Ambulatory Visit: Payer: Self-pay | Attending: Physician Assistant | Admitting: Physician Assistant

## 2022-10-07 ENCOUNTER — Encounter: Payer: Self-pay | Admitting: Physician Assistant

## 2022-10-07 VITALS — BP 111/68 | HR 63 | Wt 140.6 lb

## 2022-10-07 DIAGNOSIS — N898 Other specified noninflammatory disorders of vagina: Secondary | ICD-10-CM

## 2022-10-07 DIAGNOSIS — H539 Unspecified visual disturbance: Secondary | ICD-10-CM

## 2022-10-07 DIAGNOSIS — H6993 Unspecified Eustachian tube disorder, bilateral: Secondary | ICD-10-CM

## 2022-10-07 DIAGNOSIS — D508 Other iron deficiency anemias: Secondary | ICD-10-CM

## 2022-10-07 DIAGNOSIS — R1030 Lower abdominal pain, unspecified: Secondary | ICD-10-CM

## 2022-10-07 DIAGNOSIS — G44209 Tension-type headache, unspecified, not intractable: Secondary | ICD-10-CM

## 2022-10-07 DIAGNOSIS — Z758 Other problems related to medical facilities and other health care: Secondary | ICD-10-CM

## 2022-10-07 DIAGNOSIS — R42 Dizziness and giddiness: Secondary | ICD-10-CM

## 2022-10-07 DIAGNOSIS — Z603 Acculturation difficulty: Secondary | ICD-10-CM

## 2022-10-07 LAB — POCT URINALYSIS DIP (CLINITEK)
Bilirubin, UA: NEGATIVE
Blood, UA: NEGATIVE
Glucose, UA: NEGATIVE mg/dL
Ketones, POC UA: NEGATIVE mg/dL
Leukocytes, UA: NEGATIVE
Nitrite, UA: NEGATIVE
POC PROTEIN,UA: NEGATIVE
Spec Grav, UA: 1.025 (ref 1.010–1.025)
Urobilinogen, UA: 0.2 E.U./dL
pH, UA: 7 (ref 5.0–8.0)

## 2022-10-07 LAB — POCT URINE PREGNANCY: Preg Test, Ur: NEGATIVE

## 2022-10-07 MED ORDER — FLUTICASONE PROPIONATE 50 MCG/ACT NA SUSP
2.0000 | Freq: Every day | NASAL | 6 refills | Status: AC
Start: 2022-10-07 — End: ?
  Filled 2022-10-07: qty 16, 30d supply, fill #0

## 2022-10-07 MED ORDER — METHOCARBAMOL 500 MG PO TABS
1000.0000 mg | ORAL_TABLET | Freq: Three times a day (TID) | ORAL | 0 refills | Status: AC | PRN
  Filled 2022-10-07: qty 90, 15d supply, fill #0

## 2022-10-07 MED ORDER — IBUPROFEN 600 MG PO TABS: 600.0000 mg | ORAL_TABLET | Freq: Three times a day (TID) | ORAL | 0 refills | Status: DC | PRN

## 2022-10-07 MED ORDER — METHOCARBAMOL 500 MG PO TABS: 1000.0000 mg | ORAL_TABLET | Freq: Three times a day (TID) | ORAL | 0 refills | Status: DC | PRN

## 2022-10-07 MED ORDER — FLUTICASONE PROPIONATE 50 MCG/ACT NA SUSP: 2.0000 | Freq: Every day | NASAL | 6 refills | Status: DC

## 2022-10-07 MED ORDER — IBUPROFEN 600 MG PO TABS
600.0000 mg | ORAL_TABLET | Freq: Three times a day (TID) | ORAL | 0 refills | Status: AC | PRN
  Filled 2022-10-07: qty 60, 20d supply, fill #0

## 2022-10-07 NOTE — Patient Instructions (Signed)
Mareos Dizziness Los mareos son un problema muy frecuente. Causan sensacin de inestabilidad o de desvanecimiento. Puede sentir que se va a desmayar. Los [employer] pueden provocarle una lesin si se tropieza o se cae. La causa puede deberse a [employer], tales como los siguientes: Medicamentos. No tener suficiente agua en el cuerpo (deshidratacin). Enfermedad. Siga estas instrucciones en su casa: Comida y bebida  Beba suficiente lquido para mantener el pis (orina) de color amarillo plido. Esto evita la deshidratacin. Trate de beber ms lquidos transparentes, como agua. No beba alcohol. Limite la cantidad de cafena que bebe o come si el mdico se lo indica. Limite la cantidad de sal (sodio) que bebe o come si el mdico se lo indica. Actividad  Evite los movimientos rpidos. Cuando se levante de una silla, hgalo con lentitud y sujtese hasta sentirse bien. Por la maana, sintese primero a un lado de la cama. Cuando se sienta bien, pngase lentamente de pie mientras se sostiene de algo. Haga esto hasta que se sienta seguro en cuanto al equilibrio. Mueva las piernas con frecuencia si debe estar de pie en un lugar durante mucho tiempo. Mientras est de pie, contraiga y relaje los msculos de las piernas. No conduzca vehculos ni opere maquinaria si se siente mareado. Evite agacharse si se siente mareado. En su casa, coloque los objetos en algn lugar que le resulte fcil alcanzarlos sin agacharse. Estilo de vida No fume ni consuma ningn producto que contenga nicotina o tabaco. Si necesita ayuda para dejar de fumar, consulte al mdico. Intente bajar el nivel de estrs. Para hacerlo, puede usar mtodos como el yoga o la meditacin. Hable con el mdico si necesita ayuda. Instrucciones generales Controle sus mareos para ver si hay cambios. Use los medicamentos de venta libre y los recetados solamente como se lo haya indicado el mdico. Hable con el mdico si cree que la causa de sus  mareos es algn medicamento que est tomando. Infrmele a un amigo o a un familiar si se siente mareado. Pdale a esta persona que llame al mdico si observa cambios en su comportamiento. Concurra a todas las visitas de seguimiento. Comunquese con un mdico si: Los [employer]. Los [employer] o la sensacin de Production assistant, radio. Tiene ganas de vomitar (nuseas). Tiene problemas para escuchar. Aparecen nuevos sntomas. Siente inestabilidad al estar de pie. Siente que la Biochemist, clinical vueltas. Dolor o rigidez en el cuello. Tiene fiebre. Solicite ayuda de inmediato si: Vomita o tiene heces acuosas (diarrea), y no puede comer o beber nada. Tiene dificultad para hacer lo siguiente: Hablar. Caminar. Tragar. Usar los brazos, las manos o las piernas. Se siente constantemente dbil. No piensa con claridad o tiene dificultad para armar oraciones. Es posible que un amigo o un familiar adviertan que esto ocurre. Tiene los siguientes sntomas: Journalist, newspaper. Dolor en el vientre (abdomen). Falta de aire. Sudoracin. Presenta cambios en la visin. Sangrado. Tiene un dolor de cabeza muy intenso. Estos sntomas pueden Customer service manager. Solicite ayuda de inmediato. Comunquese con el servicio de emergencias de su localidad (911 en los [place]). No espere a ver si los sntomas desaparecen. No conduzca por sus propios medios [employer]. Resumen Los mareos causan sensacin de inestabilidad o de desvanecimiento. Puede sentir que se va a desmayar. Beba suficiente lquido para Radio producer pis (orina) de color amarillo plido. No beba alcohol. Evite los movimientos rpidos si se siente mareado. Controle sus mareos para ver si hay cambios. Esta informacin no tiene como fin  reemplazar el consejo del mdico. Asegrese de hacerle al mdico cualquier pregunta que tenga. Document Revised: 04/21/2020 Document Reviewed: 04/21/2020 Elsevier Patient Education  2024 [employer].

## 2022-10-07 NOTE — Progress Notes (Signed)
Patient ID: Stephanie Quinn, female   DOB: 1995/08/03, 27 y.o.   MRN: [id number]     Stephanie Quinn, is a 27 y.o. female  [id number]  [id number]  DOB - 11-27-1995  Chief Complaint  Patient presents with   Headache   low energy   bleeding during intercorse    vaginal ordor       Subjective:   Stephanie Quinn is a 27 y.o. female here today for several concerns.   Vaginal discharge, abdominal pain, some bleeding with intercourse.  This has been going for about 3 weeks.  On depo provera.   Most recent injection 4/30 and has been on it for about 1 year.  She has not had any bleeding on depo until the last few weeks.  Was told she has a lesion in her vagina a few months ago but does not have gyn.  No N/V.  No change in BM  Vision changes since having her second baby about 3 years ago.    HA for about 1 month-starts in back of neck.  Sometimes front of head.  She has 2 young children she has to pick up to put in car seats  Dizziness for about 2 weeks.  Admits to not drinking enough water.  Also having some sinus congestion.    Gerardo with [facility]  No problems updated.  ALLERGIES: Allergies  Allergen Reactions   Penicillins Itching and Rash    Has patient had a PCN reaction causing immediate rash, facial/tongue/throat swelling, SOB or lightheadedness with hypotension: Yes Has patient had a PCN reaction causing severe rash involving mucus membranes or skin necrosis: Yes Has patient had a PCN reaction that required hospitalization: No Has patient had a PCN reaction occurring within the last 10 years: No If all of the above answers are "NO", then may proceed with Cephalosporin use.    PAST MEDICAL HISTORY: Past Medical History:  Diagnosis Date   Medical history non-contributory     MEDICATIONS AT HOME: Prior to Admission medications   Medication Sig Start Date End Date Taking? Authorizing Provider  fluticasone (FLONASE) 50 MCG/ACT nasal spray Place 2  sprays into both nostrils daily. 10/07/22  Yes Georgian Co M, PA-C  ibuprofen (ADVIL) 600 MG tablet Take 1 tablet (600 mg total) by mouth every 8 (eight) hours as needed. 10/07/22  Yes Rise Traeger, Marzella Schlein, PA-C  methocarbamol (ROBAXIN) 500 MG tablet Take 2 tablets (1,000 mg total) by mouth every 8 (eight) hours as needed for muscle spasms. And headache 10/07/22  Yes Anders Simmonds, PA-C  azelastine (ASTELIN) 0.1 % nasal spray Place 2 sprays into both nostrils 2 (two) times daily. Use in each nostril as directed Patient not taking: Reported on 10/07/2022 01/27/22   Storm Frisk, MD    ROS: Neg resp Neg cardiac Neg GI Neg MS Neg psych  Objective:   Vitals:   10/07/22 0956  BP: 111/68  Pulse: 63  SpO2: 100%  Weight: 140 lb 9.6 oz (63.8 kg)   Exam General appearance : Awake, alert, not in any distress. Speech Clear. Not toxic looking HEENT: Atraumatic and Normocephalic, pupils equally reactive to light and accomodation, EOM intact.  TM B bulging but no infection.  Turbinates boggy Neck: Supple, no JVD. No cervical lymphadenopathy. Back of neck with spasm and tenderness B Chest: Good air entry bilaterally, CTAB.  No rales/rhonchi/wheezing CVS: S1 S2 regular, no murmurs.  Extremities: B/L Lower Ext shows no edema, both legs are warm to  touch Neurology: Awake alert, and oriented X 3, CN II-XII intact, Non focal Skin: No Rash  Data Review Lab Results  Component Value Date   HGBA1C 5.3 01/27/2022    Assessment & Plan   1. Vaginal discharge - Cervicovaginal ancillary only - POCT URINALYSIS DIP (CLINITEK) - POCT urine pregnancy - Ambulatory referral to Gynecology  2. Lower abdominal pain - POCT URINALYSIS DIP (CLINITEK) - POCT urine pregnancy - Comprehensive metabolic panel  3. Dizziness Increase water intake.  No red flags - TSH - Vitamin D, 25-hydroxy - fluticasone (FLONASE) 50 MCG/ACT nasal spray; Place 2 sprays into both nostrils daily.  Dispense: 16 g; Refill:  6  4. Tension headache No red flags - TSH - Comprehensive metabolic panel - methocarbamol (ROBAXIN) 500 MG tablet; Take 2 tablets (1,000 mg total) by mouth every 8 (eight) hours as needed for muscle spasms. And headache  Dispense: 90 tablet; Refill: 0 - ibuprofen (ADVIL) 600 MG tablet; Take 1 tablet (600 mg total) by mouth every 8 (eight) hours as needed.  Dispense: 60 tablet; Refill: 0  5. Vision changes Not acute-occurring over 3 years - Ambulatory referral to Ophthalmology  6. Eustachian tube dysfunction, bilateral Likely contributing to 3&4 - fluticasone (FLONASE) 50 MCG/ACT nasal spray; Place 2 sprays into both nostrils daily.  Dispense: 16 g; Refill: 6  7. Language barrier [facility] [place] interpreters used and additional time performing visit was required.   8. Other iron deficiency anemia Checking due to 3& 4 - CBC with Differential/Platelet  9. Vaginal lesion Per patient and recent pap - Ambulatory referral to Gynecology    Return if symptoms worsen or fail to improve.  The patient was given clear instructions to go to ER or return to medical center if symptoms don't improve, worsen or new problems develop. The patient verbalized understanding. The patient was told to call to get lab results if they haven't heard anything in the next week.      Georgian Co, PA-C [facility] and [facility] [place], [place] [phone]   10/07/2022, 10:27 AM

## 2022-10-08 ENCOUNTER — Other Ambulatory Visit: Payer: Self-pay | Admitting: Physician Assistant

## 2022-10-08 LAB — CERVICOVAGINAL ANCILLARY ONLY
Bacterial Vaginitis (gardnerella): POSITIVE — AB
Candida Glabrata: NEGATIVE
Candida Vaginitis: NEGATIVE
Chlamydia: NEGATIVE
Comment: NEGATIVE
Comment: NEGATIVE
Comment: NEGATIVE
Comment: NEGATIVE
Comment: NEGATIVE
Comment: NORMAL
Neisseria Gonorrhea: NEGATIVE
Trichomonas: NEGATIVE

## 2022-10-08 LAB — CBC WITH DIFFERENTIAL/PLATELET
Basophils Absolute: 0.1 10*3/uL (ref 0.0–0.2)
Basos: 1 %
EOS (ABSOLUTE): 0.4 10*3/uL (ref 0.0–0.4)
Eos: 5 %
Hematocrit: 41.6 % (ref 34.0–46.6)
Hemoglobin: 14 g/dL (ref 11.1–15.9)
Immature Grans (Abs): 0 10*3/uL (ref 0.0–0.1)
Immature Granulocytes: 0 %
Lymphocytes Absolute: 3.4 10*3/uL — ABNORMAL HIGH (ref 0.7–3.1)
Lymphs: 39 %
MCH: 28.7 pg (ref 26.6–33.0)
MCHC: 33.7 g/dL (ref 31.5–35.7)
MCV: 85 fL (ref 79–97)
Monocytes Absolute: 0.4 10*3/uL (ref 0.1–0.9)
Monocytes: 5 %
Neutrophils Absolute: 4.3 10*3/uL (ref 1.4–7.0)
Neutrophils: 50 %
Platelets: 249 10*3/uL (ref 150–450)
RBC: 4.87 x10E6/uL (ref 3.77–5.28)
RDW: 12.4 % (ref 11.7–15.4)
WBC: 8.6 10*3/uL (ref 3.4–10.8)

## 2022-10-08 LAB — COMPREHENSIVE METABOLIC PANEL
ALT: 20 IU/L (ref 0–32)
AST: 25 IU/L (ref 0–40)
Albumin/Globulin Ratio: 1.9 (ref 1.2–2.2)
Albumin: 5 g/dL (ref 4.0–5.0)
Alkaline Phosphatase: 91 IU/L (ref 44–121)
BUN/Creatinine Ratio: 28 — ABNORMAL HIGH (ref 9–23)
BUN: 19 mg/dL (ref 6–20)
Bilirubin Total: 0.3 mg/dL (ref 0.0–1.2)
CO2: 21 mmol/L (ref 20–29)
Calcium: 9.1 mg/dL (ref 8.7–10.2)
Chloride: 106 mmol/L (ref 96–106)
Creatinine, Ser: 0.69 mg/dL (ref 0.57–1.00)
Globulin, Total: 2.7 g/dL (ref 1.5–4.5)
Glucose: 85 mg/dL (ref 70–99)
Potassium: 4.3 mmol/L (ref 3.5–5.2)
Sodium: 142 mmol/L (ref 134–144)
Total Protein: 7.7 g/dL (ref 6.0–8.5)
eGFR: 123 mL/min/{1.73_m2} (ref >59)

## 2022-10-08 LAB — TSH: TSH: 1.55 u[IU]/mL (ref 0.450–4.500)

## 2022-10-08 LAB — VITAMIN D 25 HYDROXY (VIT D DEFICIENCY, FRACTURES): Vit D, 25-Hydroxy: 47.2 ng/mL (ref 30.0–100.0)

## 2022-10-08 MED ORDER — METRONIDAZOLE 500 MG PO TABS: 500.0000 mg | ORAL_TABLET | Freq: Two times a day (BID) | ORAL | 0 refills | Status: AC

## 2022-10-08 MED ORDER — FLUCONAZOLE 150 MG PO TABS: 150.0000 mg | ORAL_TABLET | Freq: Once | ORAL | 0 refills | Status: AC

## 2023-09-13 ENCOUNTER — Ambulatory Visit
Admission: EM | Admit: 2023-09-13 | Discharge: 2023-09-13 | Disposition: A | Discharge status: Home / Self Care | Payer: Self-pay | Attending: Family Medicine | Admitting: Family Medicine

## 2023-09-13 DIAGNOSIS — H1013 Acute atopic conjunctivitis, bilateral: Secondary | ICD-10-CM

## 2023-09-13 DIAGNOSIS — J4521 Mild intermittent asthma with (acute) exacerbation: Secondary | ICD-10-CM

## 2023-09-13 DIAGNOSIS — J309 Allergic rhinitis, unspecified: Secondary | ICD-10-CM

## 2023-09-13 DIAGNOSIS — J988 Other specified respiratory disorders: Secondary | ICD-10-CM

## 2023-09-13 DIAGNOSIS — B9789 Other viral agents as the cause of diseases classified elsewhere: Secondary | ICD-10-CM

## 2023-09-13 LAB — POC COVID19/FLU A&B COMBO
Covid Antigen, POC: NEGATIVE
Influenza A Antigen, POC: NEGATIVE
Influenza B Antigen, POC: NEGATIVE

## 2023-09-13 MED ORDER — IPRATROPIUM-ALBUTEROL 0.5-2.5 (3) MG/3ML IN SOLN
3.0000 mL | Freq: Once | RESPIRATORY_TRACT | Status: AC
  Administered 2023-09-13: 3 mL via RESPIRATORY_TRACT

## 2023-09-13 MED ORDER — OLOPATADINE HCL 0.1 % OP SOLN: 1.0000 [drp] | Freq: Two times a day (BID) | OPHTHALMIC | 0 refills | Status: AC

## 2023-09-13 MED ORDER — METHYLPREDNISOLONE ACETATE 80 MG/ML IJ SUSP
80.0000 mg | Freq: Once | INTRAMUSCULAR | Status: AC
  Administered 2023-09-13: 80 mg via INTRAMUSCULAR

## 2023-09-13 MED ORDER — ALBUTEROL SULFATE HFA 108 (90 BASE) MCG/ACT IN AERS
1.0000 | INHALATION_SPRAY | Freq: Four times a day (QID) | RESPIRATORY_TRACT | 0 refills | Status: AC | PRN
Start: 2023-09-13 — End: ?

## 2023-09-13 MED ORDER — CETIRIZINE HCL 10 MG PO TABS: 10.0000 mg | ORAL_TABLET | Freq: Every day | ORAL | 0 refills | Status: AC

## 2023-09-13 NOTE — ED Provider Notes (Signed)
 [facility]  Note:  This document was prepared using Conservation officer, historic buildings and may include unintentional dictation errors.  MRN: [id number] DOB: 1995-08-11  Subjective:   Stephanie Quinn is a 28 y.o. female presenting for 3-day history of acute onset persistent and worsening shortness of breath, chest tightness, dry hacking coughing, sinus congestion and allergies, worsening malaise and fatigue.  She has itchiness of her eyes.  Has a history of difficult to control allergies.  No sinus pain, ear pain, chest pain, nausea, vomiting, abdominal pain, urinary symptoms, rashes, headaches, confusion.  No smoking of any kind including cigarettes, cigars, vaping, marijuana use.    No current facility-administered medications for this encounter.  Current Outpatient Medications:    azelastine  (ASTELIN ) 0.1 % nasal spray, Place 2 sprays into both nostrils 2 (two) times daily. Use in each nostril as directed (Patient not taking: Reported on 10/07/2022), Disp: 30 mL, Rfl: 12   fluticasone  (FLONASE ) 50 MCG/ACT nasal spray, Place 2 sprays into both nostrils daily., Disp: 16 g, Rfl: 6   ibuprofen  (ADVIL ) 600 MG tablet, Take 1 tablet (600 mg total) by mouth every 8 (eight) hours as needed., Disp: 60 tablet, Rfl: 0   methocarbamol  (ROBAXIN ) 500 MG tablet, Take 2 tablets (1,000 mg total) by mouth every 8 (eight) hours as needed for muscle spasms. And headache, Disp: 90 tablet, Rfl: 0   metroNIDAZOLE  (FLAGYL ) 500 MG tablet, Take 1 tablet (500 mg total) by mouth 2 (two) times daily., Disp: 14 tablet, Rfl: 0   Allergies  Allergen Reactions   Penicillins Itching and Rash    Has patient had a PCN reaction causing immediate rash, facial/tongue/throat swelling, SOB or lightheadedness with hypotension: Yes Has patient had a PCN reaction causing severe rash involving mucus membranes or skin necrosis: Yes Has patient had a PCN reaction that required hospitalization: No Has  patient had a PCN reaction occurring within the last 10 years: No If all of the above answers are "NO", then may proceed with Cephalosporin use.    Past Medical History:  Diagnosis Date   Medical history non-contributory      Past Surgical History:  Procedure Laterality Date   NO PAST SURGERIES     WISDOM TOOTH EXTRACTION      Family History  Problem Relation Age of Onset   Ovarian cysts Mother     Social History   Tobacco Use   Smoking status: Never   Smokeless tobacco: Never  Vaping Use   Vaping status: Never Used  Substance Use Topics   Alcohol use: No   Drug use: No    ROS   Objective:   Vitals: BP 132/80 (BP Location: Right Arm)   Pulse 83   Temp 98 F (36.7 C) (Oral)   Resp (!) 24   SpO2 95%   Physical Exam Constitutional:      General: She is not in acute distress.    Appearance: Normal appearance. She is well-developed and normal weight. She is not ill-appearing, toxic-appearing or diaphoretic.  HENT:     Head: Normocephalic and atraumatic.     Right Ear: Tympanic membrane, ear canal and external ear normal. No drainage or tenderness. No middle ear effusion. There is no impacted cerumen. Tympanic membrane is not erythematous or bulging.     Left Ear: Tympanic membrane, ear canal and external ear normal. No drainage or tenderness.  No middle ear effusion. There is no impacted cerumen. Tympanic membrane is not erythematous or  bulging.     Nose: Nose normal. No congestion or rhinorrhea.     Mouth/Throat:     Mouth: Mucous membranes are moist. No oral lesions.     Pharynx: No pharyngeal swelling, oropharyngeal exudate, posterior oropharyngeal erythema or uvula swelling.     Tonsils: No tonsillar exudate or tonsillar abscesses.  Eyes:     General: Lids are normal. Lids are everted, no foreign bodies appreciated. Vision grossly intact. No scleral icterus.       Right eye: No foreign body, discharge or hordeolum.        Left eye: No foreign body,  discharge or hordeolum.     Extraocular Movements: Extraocular movements intact.     Right eye: Normal extraocular motion.     Left eye: Normal extraocular motion and no nystagmus.     Conjunctiva/sclera:     Right eye: Right conjunctiva is injected. No chemosis, exudate or hemorrhage.    Left eye: Left conjunctiva is injected. No chemosis, exudate or hemorrhage. Cardiovascular:     Rate and Rhythm: Normal rate and regular rhythm.     Heart sounds: Normal heart sounds. No murmur heard.    No friction rub. No gallop.  Pulmonary:     Effort: Pulmonary effort is normal. Tachypnea present. No respiratory distress.     Breath sounds: No stridor. Examination of the right-upper field reveals wheezing. Examination of the left-upper field reveals wheezing. Examination of the right-middle field reveals wheezing. Examination of the left-middle field reveals wheezing. Examination of the right-lower field reveals wheezing. Examination of the left-lower field reveals wheezing. Wheezing present. No decreased breath sounds, rhonchi or rales.  Chest:     Chest wall: No tenderness.  Musculoskeletal:     Cervical back: Normal range of motion and neck supple.  Lymphadenopathy:     Cervical: No cervical adenopathy.  Skin:    General: Skin is warm and dry.  Neurological:     General: No focal deficit present.     Mental Status: She is alert and oriented to person, place, and time.  Psychiatric:        Mood and Affect: Mood normal.        Behavior: Behavior normal.    A DuoNeb treatment of 0.5 mg - 2.5 mg ipratropium albuterol was administered in clinic.  IM Depo-Medrol 80 mg administered in clinic.  This resulted in dramatic improvement in her breathing including resolution of her wheezing and tachypnea.  Pulmonary exam was clear, RR was 20 thereafter.  Results for orders placed or performed during the hospital encounter of 09/13/23 (from the past 24 hours)  POC Covid19/Flu A&B Antigen     Status: None    Collection Time: 09/13/23 10:26 AM  Result Value Ref Range   Influenza A Antigen, POC Negative Negative   Influenza B Antigen, POC Negative Negative   Covid Antigen, POC Negative Negative    Assessment and Plan :   PDMP not reviewed this encounter.  1. Viral respiratory infection   2. Allergic rhinitis, unspecified seasonality, unspecified trigger   3. Allergic conjunctivitis and rhinitis, bilateral   4. Mild intermittent reactive airway disease with acute exacerbation    Will defer imaging given clear pulmonary exam following the duo nebulizer treatment and IM steroids.  Respiratory testing negative.  Recommend supportive care for viral respiratory infection.  Counseled patient on potential for adverse effects with medications prescribed/recommended today, ER and return-to-clinic precautions discussed, patient verbalized understanding.    Adolph Hoop, [place] 09/13/23 1047

## 2023-09-13 NOTE — ED Triage Notes (Addendum)
 Through video interpreter pt c/o dry cough, SHOB, fever "allergy in eyes" x 3 days-no meds PTA today-last dose tylenol  last night

## 2023-10-01 IMAGING — CR DG FOOT COMPLETE 3+V*R*
3 series · 3 of 3 positions shown · non-contrast
Comparison: None.

CLINICAL DATA: Fall

EXAM:
RIGHT FOOT COMPLETE - 3+ VIEW

[foot ap]
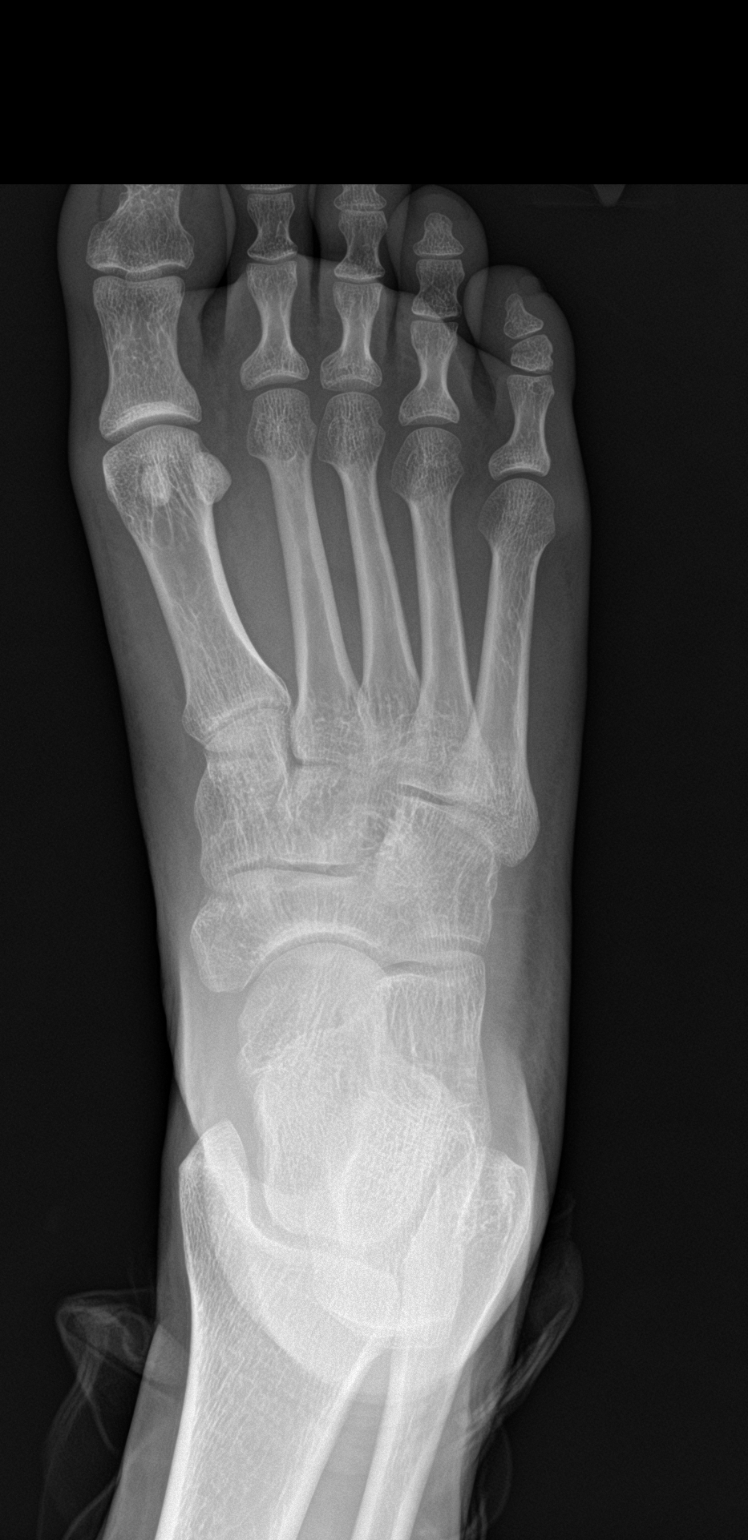

[foot obl]
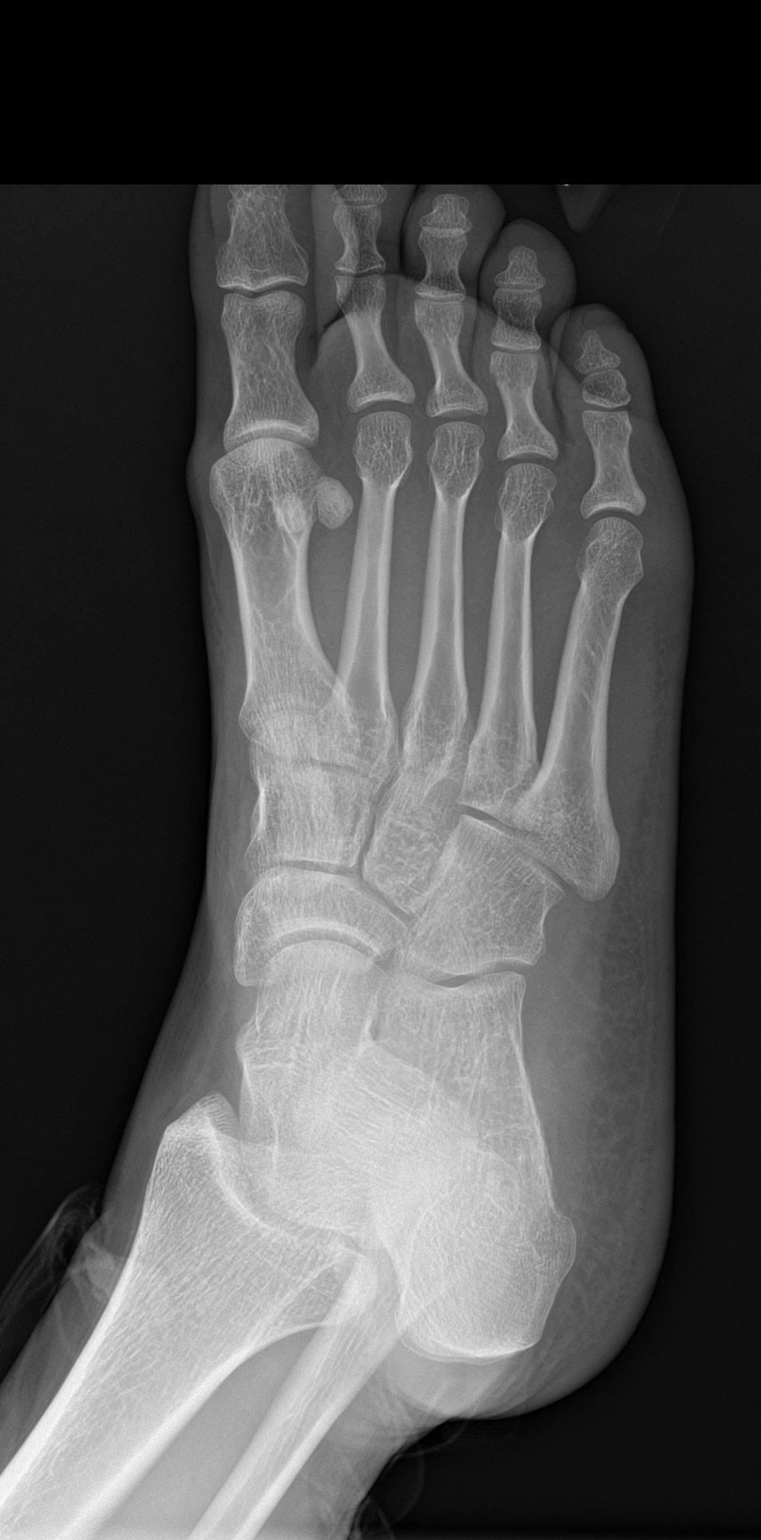

[foot lat]
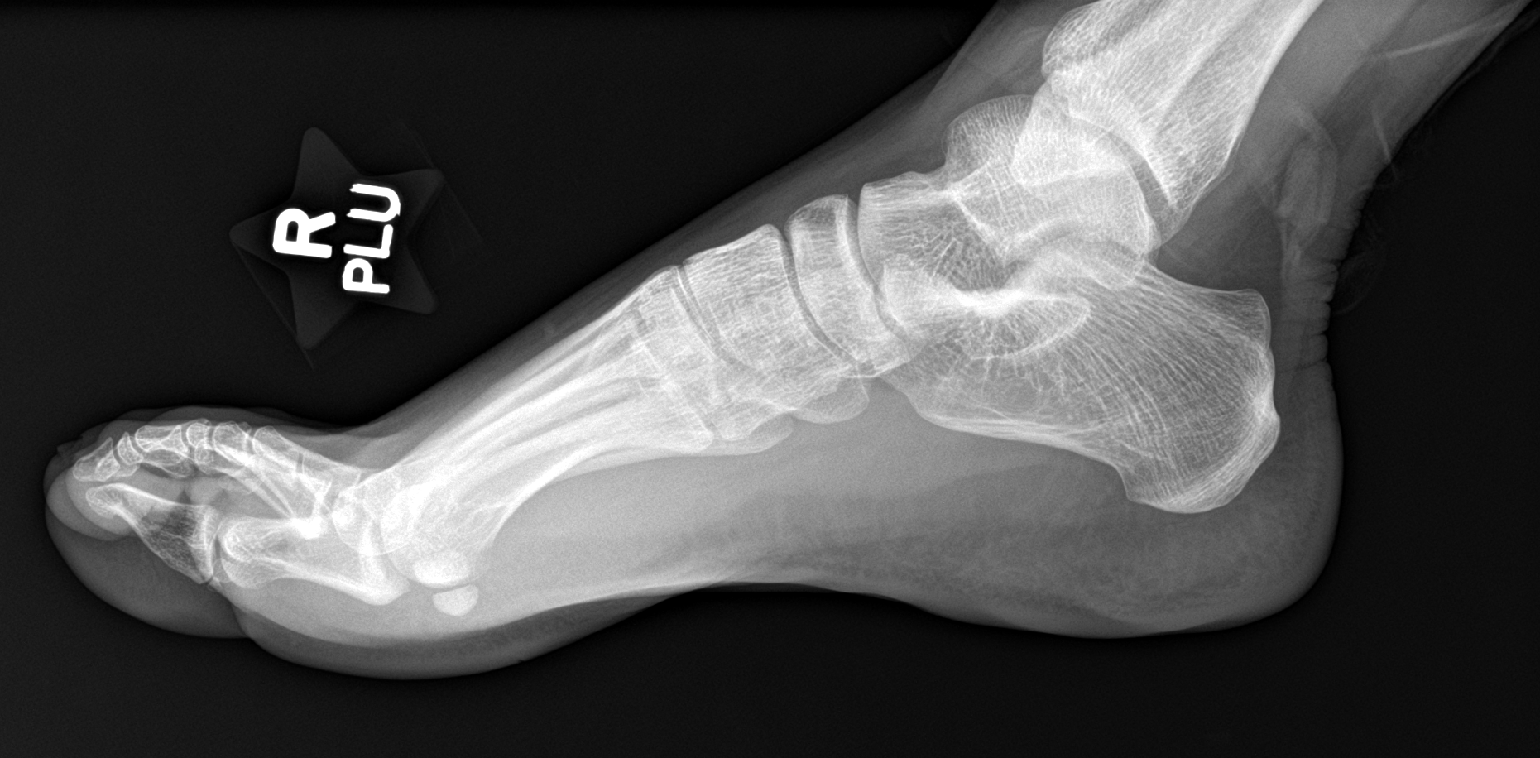

[3 of 3 positions shown; findings below may reference images not displayed]

FINDINGS: No fracture or dislocation is seen.

The joint spaces are preserved.

The visualized soft tissues are unremarkable.
IMPRESSION: Negative.

## 2024-02-27 ENCOUNTER — Other Ambulatory Visit: Payer: Self-pay | Admitting: Family

## 2024-02-27 DIAGNOSIS — R102 Pelvic and perineal pain unspecified side: Secondary | ICD-10-CM

## 2024-02-27 DIAGNOSIS — D259 Leiomyoma of uterus, unspecified: Secondary | ICD-10-CM

## 2024-03-05 ENCOUNTER — Ambulatory Visit (HOSPITAL_BASED_OUTPATIENT_CLINIC_OR_DEPARTMENT_OTHER)
Admission: RE | Admit: 2024-03-05 | Discharge: 2024-03-05 | Disposition: A | Discharge status: Home / Self Care | Payer: Self-pay | Source: Ambulatory Visit | Attending: Family | Admitting: Family

## 2024-03-05 DIAGNOSIS — R102 Pelvic and perineal pain unspecified side: Secondary | ICD-10-CM | POA: Insufficient documentation

## 2024-03-05 DIAGNOSIS — D259 Leiomyoma of uterus, unspecified: Secondary | ICD-10-CM | POA: Insufficient documentation

## 2024-07-18 ENCOUNTER — Ambulatory Visit: Payer: Self-pay | Admitting: Internal Medicine
